# Patient Record
Sex: Female | Born: 1955 | Hispanic: No | Smoking: Smoker, current status unknown
Health system: Southern US, Community
[De-identification: ages and names within clinical notes are randomized; demographics above are authoritative.]

## PROBLEM LIST (undated history)

## (undated) DIAGNOSIS — A0472 Enterocolitis due to Clostridium difficile, not specified as recurrent: Secondary | ICD-10-CM

## (undated) DIAGNOSIS — I255 Ischemic cardiomyopathy: Secondary | ICD-10-CM

## (undated) DIAGNOSIS — I482 Chronic atrial fibrillation, unspecified: Secondary | ICD-10-CM

## (undated) DIAGNOSIS — Z85819 Personal history of malignant neoplasm of unspecified site of lip, oral cavity, and pharynx: Secondary | ICD-10-CM

## (undated) DIAGNOSIS — F329 Major depressive disorder, single episode, unspecified: Secondary | ICD-10-CM

## (undated) DIAGNOSIS — F32A Depression, unspecified: Secondary | ICD-10-CM

## (undated) DIAGNOSIS — J9621 Acute and chronic respiratory failure with hypoxia: Secondary | ICD-10-CM

## (undated) DIAGNOSIS — I5021 Acute systolic (congestive) heart failure: Secondary | ICD-10-CM

## (undated) DIAGNOSIS — J189 Pneumonia, unspecified organism: Secondary | ICD-10-CM

## (undated) DIAGNOSIS — Z430 Encounter for attention to tracheostomy: Secondary | ICD-10-CM

## (undated) DIAGNOSIS — F419 Anxiety disorder, unspecified: Secondary | ICD-10-CM

## (undated) DIAGNOSIS — D7582 Heparin induced thrombocytopenia (HIT): Secondary | ICD-10-CM

## (undated) HISTORY — PX: TRACHEOSTOMY: SUR1362

## (undated) HISTORY — PX: CORONARY ARTERY BYPASS GRAFT: SHX141

## (undated) HISTORY — PX: ABOVE KNEE LEG AMPUTATION: SUR20

## (undated) HISTORY — PX: MANDIBULECTOMY: SHX1999

## (undated) HISTORY — PX: PEG PLACEMENT: SHX5437

---

## 2018-06-09 DIAGNOSIS — C4492 Squamous cell carcinoma of skin, unspecified: Secondary | ICD-10-CM

## 2018-06-09 DIAGNOSIS — A419 Sepsis, unspecified organism: Secondary | ICD-10-CM

## 2018-06-09 DIAGNOSIS — I5022 Chronic systolic (congestive) heart failure: Secondary | ICD-10-CM

## 2018-06-09 DIAGNOSIS — I482 Chronic atrial fibrillation: Secondary | ICD-10-CM | POA: Diagnosis not present

## 2018-06-09 DIAGNOSIS — J9621 Acute and chronic respiratory failure with hypoxia: Secondary | ICD-10-CM

## 2018-06-10 DIAGNOSIS — C4492 Squamous cell carcinoma of skin, unspecified: Secondary | ICD-10-CM

## 2018-06-10 DIAGNOSIS — I5022 Chronic systolic (congestive) heart failure: Secondary | ICD-10-CM | POA: Diagnosis not present

## 2018-06-10 DIAGNOSIS — A419 Sepsis, unspecified organism: Secondary | ICD-10-CM | POA: Diagnosis not present

## 2018-06-10 DIAGNOSIS — J9621 Acute and chronic respiratory failure with hypoxia: Secondary | ICD-10-CM | POA: Diagnosis not present

## 2018-06-10 DIAGNOSIS — I482 Chronic atrial fibrillation: Secondary | ICD-10-CM

## 2018-07-20 ENCOUNTER — Inpatient Hospital Stay
Admission: EM | Admit: 2018-07-20 | Discharge: 2018-08-16 | Disposition: A | Discharge status: Rehabilitation Facility | Payer: Medicare Other | Source: Other Acute Inpatient Hospital | Attending: Internal Medicine | Admitting: Internal Medicine

## 2018-07-20 ENCOUNTER — Other Ambulatory Visit (HOSPITAL_COMMUNITY): Payer: Self-pay

## 2018-07-20 DIAGNOSIS — R58 Hemorrhage, not elsewhere classified: Secondary | ICD-10-CM

## 2018-07-20 DIAGNOSIS — Z431 Encounter for attention to gastrostomy: Secondary | ICD-10-CM

## 2018-07-20 DIAGNOSIS — Z931 Gastrostomy status: Secondary | ICD-10-CM

## 2018-07-20 DIAGNOSIS — F330 Major depressive disorder, recurrent, mild: Secondary | ICD-10-CM

## 2018-07-20 DIAGNOSIS — J189 Pneumonia, unspecified organism: Secondary | ICD-10-CM

## 2018-07-20 DIAGNOSIS — J9690 Respiratory failure, unspecified, unspecified whether with hypoxia or hypercapnia: Secondary | ICD-10-CM

## 2018-07-20 DIAGNOSIS — J90 Pleural effusion, not elsewhere classified: Secondary | ICD-10-CM

## 2018-07-20 DIAGNOSIS — Z430 Encounter for attention to tracheostomy: Secondary | ICD-10-CM

## 2018-07-20 DIAGNOSIS — D75829 Heparin-induced thrombocytopenia, unspecified: Secondary | ICD-10-CM

## 2018-07-20 DIAGNOSIS — D7582 Heparin induced thrombocytopenia (HIT): Secondary | ICD-10-CM

## 2018-07-20 DIAGNOSIS — J9621 Acute and chronic respiratory failure with hypoxia: Secondary | ICD-10-CM | POA: Diagnosis present

## 2018-07-20 DIAGNOSIS — I5021 Acute systolic (congestive) heart failure: Secondary | ICD-10-CM | POA: Diagnosis present

## 2018-07-20 DIAGNOSIS — F411 Generalized anxiety disorder: Secondary | ICD-10-CM

## 2018-07-20 DIAGNOSIS — I482 Chronic atrial fibrillation, unspecified: Secondary | ICD-10-CM | POA: Diagnosis present

## 2018-07-20 HISTORY — DX: Acute systolic (congestive) heart failure: I50.21

## 2018-07-20 HISTORY — DX: Pneumonia, unspecified organism: J18.9

## 2018-07-20 HISTORY — DX: Personal history of malignant neoplasm of unspecified site of lip, oral cavity, and pharynx: Z85.819

## 2018-07-20 HISTORY — DX: Acute and chronic respiratory failure with hypoxia: J96.21

## 2018-07-20 HISTORY — DX: Chronic atrial fibrillation, unspecified: I48.20

## 2018-07-20 HISTORY — DX: Ischemic cardiomyopathy: I25.5

## 2018-07-20 HISTORY — DX: Anxiety disorder, unspecified: F41.9

## 2018-07-20 HISTORY — DX: Major depressive disorder, single episode, unspecified: F32.9

## 2018-07-20 HISTORY — DX: Enterocolitis due to Clostridium difficile, not specified as recurrent: A04.72

## 2018-07-20 HISTORY — DX: Encounter for attention to tracheostomy: Z43.0

## 2018-07-20 HISTORY — DX: Depression, unspecified: F32.A

## 2018-07-20 HISTORY — DX: Heparin induced thrombocytopenia (HIT): D75.82

## 2018-07-20 LAB — BLOOD GAS, ARTERIAL
Acid-Base Excess: 2.8 mmol/L — ABNORMAL HIGH (ref 0.0–2.0)
Bicarbonate: 27.3 mmol/L (ref 20.0–28.0)
FIO2: 40
MECHVT: 400 mL
O2 Saturation: 95.6 %
PEEP: 5 cmH2O
PO2 ART: 82.8 mmHg — AB (ref 83.0–108.0)
Patient temperature: 98.6
RATE: 16 resp/min
pCO2 arterial: 45.9 mmHg (ref 32.0–48.0)
pH, Arterial: 7.392 (ref 7.350–7.450)

## 2018-07-20 MED ORDER — IOPAMIDOL (ISOVUE-300) INJECTION 61%
50.0000 mL | Freq: Once | INTRAVENOUS | Status: AC | PRN
  Administered 2018-07-20: 50 mL

## 2018-07-21 ENCOUNTER — Other Ambulatory Visit (HOSPITAL_COMMUNITY): Payer: Self-pay

## 2018-07-21 LAB — BASIC METABOLIC PANEL
Anion gap: 9 (ref 5–15)
BUN: 19 mg/dL (ref 6–20)
CHLORIDE: 101 mmol/L (ref 98–111)
CO2: 27 mmol/L (ref 22–32)
Calcium: 8.3 mg/dL — ABNORMAL LOW (ref 8.9–10.3)
Creatinine, Ser: 0.68 mg/dL (ref 0.44–1.00)
GFR calc Af Amer: >60 mL/min (ref >60)
GFR calc non Af Amer: >60 mL/min (ref >60)
Glucose, Bld: 107 mg/dL — ABNORMAL HIGH (ref 70–99)
POTASSIUM: 3.5 mmol/L (ref 3.5–5.1)
Sodium: 137 mmol/L (ref 135–145)

## 2018-07-21 LAB — CBC WITH DIFFERENTIAL/PLATELET
ABS IMMATURE GRANULOCYTES: 0.1 10*3/uL (ref 0.0–0.1)
BASOS ABS: 0.2 10*3/uL — AB (ref 0.0–0.1)
Basophils Relative: 1 %
Eosinophils Absolute: 0.7 10*3/uL (ref 0.0–0.7)
Eosinophils Relative: 4 %
HCT: 28.4 % — ABNORMAL LOW (ref 36.0–46.0)
HEMOGLOBIN: 8.7 g/dL — AB (ref 12.0–15.0)
Immature Granulocytes: 1 %
LYMPHS PCT: 9 %
Lymphs Abs: 1.5 10*3/uL (ref 0.7–4.0)
MCH: 27.2 pg (ref 26.0–34.0)
MCHC: 30.6 g/dL (ref 30.0–36.0)
MCV: 88.8 fL (ref 78.0–100.0)
MONO ABS: 1.1 10*3/uL — AB (ref 0.1–1.0)
MONOS PCT: 7 %
NEUTROS ABS: 13.4 10*3/uL — AB (ref 1.7–7.7)
Neutrophils Relative %: 78 %
Platelets: 843 10*3/uL — ABNORMAL HIGH (ref 150–400)
RBC: 3.2 MIL/uL — ABNORMAL LOW (ref 3.87–5.11)
RDW: 17.2 % — ABNORMAL HIGH (ref 11.5–15.5)
WBC: 17 10*3/uL — ABNORMAL HIGH (ref 4.0–10.5)

## 2018-07-22 LAB — URINALYSIS, ROUTINE W REFLEX MICROSCOPIC
Bacteria, UA: NONE SEEN
Bilirubin Urine: NEGATIVE
GLUCOSE, UA: NEGATIVE mg/dL
HGB URINE DIPSTICK: NEGATIVE
Ketones, ur: NEGATIVE mg/dL
Nitrite: NEGATIVE
PH: 6 (ref 5.0–8.0)
Protein, ur: 30 mg/dL — AB
SPECIFIC GRAVITY, URINE: 1.02 (ref 1.005–1.030)

## 2018-07-22 LAB — CBC WITH DIFFERENTIAL/PLATELET
ABS IMMATURE GRANULOCYTES: 0.1 10*3/uL (ref 0.0–0.1)
Basophils Absolute: 0.2 10*3/uL — ABNORMAL HIGH (ref 0.0–0.1)
Basophils Relative: 1 %
EOS PCT: 4 %
Eosinophils Absolute: 0.6 10*3/uL (ref 0.0–0.7)
HCT: 26.8 % — ABNORMAL LOW (ref 36.0–46.0)
HEMOGLOBIN: 8.1 g/dL — AB (ref 12.0–15.0)
Immature Granulocytes: 1 %
LYMPHS ABS: 1.2 10*3/uL (ref 0.7–4.0)
LYMPHS PCT: 7 %
MCH: 26.9 pg (ref 26.0–34.0)
MCHC: 30.2 g/dL (ref 30.0–36.0)
MCV: 89 fL (ref 78.0–100.0)
MONO ABS: 1.1 10*3/uL — AB (ref 0.1–1.0)
Monocytes Relative: 7 %
Neutro Abs: 13.7 10*3/uL — ABNORMAL HIGH (ref 1.7–7.7)
Neutrophils Relative %: 80 %
Platelets: 783 10*3/uL — ABNORMAL HIGH (ref 150–400)
RBC: 3.01 MIL/uL — ABNORMAL LOW (ref 3.87–5.11)
RDW: 17.2 % — ABNORMAL HIGH (ref 11.5–15.5)
WBC: 17 10*3/uL — ABNORMAL HIGH (ref 4.0–10.5)

## 2018-07-22 LAB — C DIFFICILE QUICK SCREEN W PCR REFLEX
C DIFFICILE (CDIFF) INTERP: NOT DETECTED
C DIFFICILE (CDIFF) TOXIN: NEGATIVE
C DIFFICLE (CDIFF) ANTIGEN: NEGATIVE

## 2018-07-22 LAB — PHOSPHORUS: PHOSPHORUS: 4.8 mg/dL — AB (ref 2.5–4.6)

## 2018-07-22 LAB — BASIC METABOLIC PANEL
Anion gap: 9 (ref 5–15)
BUN: 17 mg/dL (ref 6–20)
CHLORIDE: 99 mmol/L (ref 98–111)
CO2: 27 mmol/L (ref 22–32)
Calcium: 8.2 mg/dL — ABNORMAL LOW (ref 8.9–10.3)
Creatinine, Ser: 0.66 mg/dL (ref 0.44–1.00)
GFR calc non Af Amer: >60 mL/min (ref >60)
Glucose, Bld: 122 mg/dL — ABNORMAL HIGH (ref 70–99)
POTASSIUM: 4.4 mmol/L (ref 3.5–5.1)
SODIUM: 135 mmol/L (ref 135–145)

## 2018-07-22 LAB — HEMOGLOBIN A1C
HEMOGLOBIN A1C: 5.3 % (ref 4.8–5.6)
MEAN PLASMA GLUCOSE: 105.41 mg/dL

## 2018-07-22 LAB — TSH: TSH: 3.777 u[IU]/mL (ref 0.350–4.500)

## 2018-07-22 LAB — PROTIME-INR
INR: 1.47
Prothrombin Time: 17.7 seconds — ABNORMAL HIGH (ref 11.4–15.2)

## 2018-07-22 LAB — MAGNESIUM: MAGNESIUM: 1.7 mg/dL (ref 1.7–2.4)

## 2018-07-23 DIAGNOSIS — I482 Chronic atrial fibrillation: Secondary | ICD-10-CM

## 2018-07-23 DIAGNOSIS — I5021 Acute systolic (congestive) heart failure: Secondary | ICD-10-CM

## 2018-07-23 DIAGNOSIS — I96 Gangrene, not elsewhere classified: Secondary | ICD-10-CM

## 2018-07-23 DIAGNOSIS — D7582 Heparin induced thrombocytopenia (HIT): Secondary | ICD-10-CM

## 2018-07-23 DIAGNOSIS — J9621 Acute and chronic respiratory failure with hypoxia: Secondary | ICD-10-CM

## 2018-07-23 DIAGNOSIS — Z430 Encounter for attention to tracheostomy: Secondary | ICD-10-CM

## 2018-07-23 DIAGNOSIS — J189 Pneumonia, unspecified organism: Secondary | ICD-10-CM

## 2018-07-23 MED ORDER — CHLORHEXIDINE GLUCONATE 0.12 % MT SOLN
15.00 | OROMUCOSAL | Status: DC
Start: ? — End: 2018-07-23

## 2018-07-23 MED ORDER — FERROUS SULFATE 300 (60 FE) MG/5ML PO SYRP
300.00 | ORAL_SOLUTION | ORAL | Status: DC
Start: 2018-07-21 — End: 2018-07-23

## 2018-07-23 MED ORDER — GENERIC EXTERNAL MEDICATION
1.00 | Status: DC
Start: 2018-07-21 — End: 2018-07-23

## 2018-07-23 MED ORDER — FUROSEMIDE 40 MG PO TABS
40.00 | ORAL_TABLET | ORAL | Status: DC
Start: 2018-07-21 — End: 2018-07-23

## 2018-07-23 MED ORDER — FOLIC ACID 1 MG PO TABS
1.00 | ORAL_TABLET | ORAL | Status: DC
Start: 2018-07-21 — End: 2018-07-23

## 2018-07-23 MED ORDER — ALBUTEROL SULFATE (2.5 MG/3ML) 0.083% IN NEBU
2.50 | INHALATION_SOLUTION | RESPIRATORY_TRACT | Status: DC
Start: ? — End: 2018-07-23

## 2018-07-23 MED ORDER — LORAZEPAM 2 MG/ML IJ SOLN
1.00 | INTRAMUSCULAR | Status: DC
Start: ? — End: 2018-07-23

## 2018-07-23 MED ORDER — IPRATROPIUM-ALBUTEROL 0.5-2.5 (3) MG/3ML IN SOLN
3.00 | RESPIRATORY_TRACT | Status: DC
Start: 2018-07-20 — End: 2018-07-23

## 2018-07-23 MED ORDER — ATORVASTATIN CALCIUM 10 MG PO TABS
20.00 | ORAL_TABLET | ORAL | Status: DC
Start: 2018-07-21 — End: 2018-07-23

## 2018-07-23 MED ORDER — ESCITALOPRAM OXALATE 5 MG PO TABS
5.00 | ORAL_TABLET | ORAL | Status: DC
Start: 2018-07-21 — End: 2018-07-23

## 2018-07-23 MED ORDER — DEXTROSE 10 % IV SOLN
125.00 | INTRAVENOUS | Status: DC
Start: ? — End: 2018-07-23

## 2018-07-23 MED ORDER — SODIUM HYPOCHLORITE EX
1.00 | CUTANEOUS | Status: DC
Start: 2018-07-20 — End: 2018-07-23

## 2018-07-23 MED ORDER — GENERIC EXTERNAL MEDICATION
50.00 | Status: DC
Start: ? — End: 2018-07-23

## 2018-07-23 MED ORDER — BUSPIRONE HCL 5 MG PO TABS
15.00 | ORAL_TABLET | ORAL | Status: DC
Start: 2018-07-20 — End: 2018-07-23

## 2018-07-23 MED ORDER — VANCOMYCIN HCL 50 MG/ML PO SOLR
125.00 | ORAL | Status: DC
Start: 2018-07-20 — End: 2018-07-23

## 2018-07-23 MED ORDER — METOPROLOL TARTRATE 25 MG PO TABS
12.50 | ORAL_TABLET | ORAL | Status: DC
Start: 2018-07-20 — End: 2018-07-23

## 2018-07-23 MED ORDER — CALCIUM-VITAMIN D3 250-125 MG-UNIT PO TABS
1.00 | ORAL_TABLET | ORAL | Status: DC
Start: 2018-07-21 — End: 2018-07-23

## 2018-07-23 MED ORDER — MEXILETINE HCL 150 MG PO CAPS
150.00 | ORAL_CAPSULE | ORAL | Status: DC
Start: 2018-07-20 — End: 2018-07-23

## 2018-07-23 MED ORDER — RANITIDINE HCL 150 MG PO TABS
150.00 | ORAL_TABLET | ORAL | Status: DC
Start: 2018-07-20 — End: 2018-07-23

## 2018-07-23 MED ORDER — OXYCODONE HCL 5 MG PO TABS
5.00 | ORAL_TABLET | ORAL | Status: DC
Start: ? — End: 2018-07-23

## 2018-07-23 MED ORDER — CLONAZEPAM 1 MG PO TABS
1.00 | ORAL_TABLET | ORAL | Status: DC
Start: 2018-07-20 — End: 2018-07-23

## 2018-07-23 MED ORDER — AMIODARONE HCL 200 MG PO TABS
200.00 | ORAL_TABLET | ORAL | Status: DC
Start: 2018-07-20 — End: 2018-07-23

## 2018-07-23 MED ORDER — GABAPENTIN 300 MG PO CAPS
300.00 | ORAL_CAPSULE | ORAL | Status: DC
Start: 2018-07-20 — End: 2018-07-23

## 2018-07-23 MED ORDER — GENERIC EXTERNAL MEDICATION
Status: DC
Start: ? — End: 2018-07-23

## 2018-07-23 MED ORDER — TICAGRELOR 90 MG PO TABS
90.00 | ORAL_TABLET | ORAL | Status: DC
Start: 2018-07-20 — End: 2018-07-23

## 2018-07-23 MED ORDER — CHLORHEXIDINE GLUCONATE 0.12 % MT SOLN
15.00 | OROMUCOSAL | Status: DC
Start: 2018-07-20 — End: 2018-07-23

## 2018-07-23 MED ORDER — GENERIC EXTERNAL MEDICATION
2.00 | Status: DC
Start: ? — End: 2018-07-23

## 2018-07-23 MED ORDER — APIXABAN 5 MG PO TABS
5.00 | ORAL_TABLET | ORAL | Status: DC
Start: 2018-07-20 — End: 2018-07-23

## 2018-07-23 MED ORDER — GENERIC EXTERNAL MEDICATION
1.00 g | Status: DC
Start: 2018-07-21 — End: 2018-07-23

## 2018-07-23 MED ORDER — THIAMINE HCL 100 MG PO TABS
100.00 | ORAL_TABLET | ORAL | Status: DC
Start: 2018-07-21 — End: 2018-07-23

## 2018-07-23 NOTE — Consult Note (Addendum)
[facility] [place] NOTE   MRN : [id number]  Reason for Consult: Left pointer and 5th digit skin changes with dry gangrene.  Referring Physician: [facility] Claudine Mouton  History of Present Illness: 62 y/o female Who was recently discharged from [facility].  She presented with ischemic right foot 7/16.  S/p Right AKA.  On 7/19 the patient developed left hand dusky finger tips.  Vascular at [facility] was notified.  Warm blankets help no further work up was done.  We have been consult to evaluate her left hand for ischemic changes to the pointer and 5th digits with dry gangrene.    Medical history includes: Active problems, CAD s/p CABG, paroxysmal A fib, right LE ischemia s/p AKA, Respiratory failure s/p Trach and PEG, HIT, HAP, C diff,  pleural effusion, cellulitis G tube insertion site.  Tobacco abuse, chronic ETOH, metastatic SCC       No current facility-administered medications for this encounter.     Pt meds include: Statin :Yes Betablocker: Yes ASA: No Other anticoagulants/antiplatelets: Brilliant and Eliquis  Past Medical History:  Diagnosis Date  . Acute on chronic respiratory failure with hypoxia ([place])   . Acute systolic heart failure ([place])   . Anxiety   . C. difficile colitis   . Chronic atrial fibrillation ([place])   . Depression   . H/O oral cancer   . Healthcare-associated pneumonia 07/24/2018  . HIT (heparin-induced thrombocytopenia) ([place]) 07/24/2018  . Ischemic cardiomyopathy   . Tracheostomy care [facility])     On paper chart   Social History Social History   Tobacco Use  . Smoking status: Smoker, Current Status Unknown  . Smokeless tobacco: Never Used  Substance Use Topics  . Alcohol use: Not Currently  . Drug use: Not Currently    Family History Family History  Family history unknown: Yes  on paper chart  Allergies not on file   REVIEW OF SYSTEMS  General: [ ]  Weight loss, [ ]  Fever, [ ]   chills Neurologic: [ ]  Dizziness, [ ]  Blackouts, [ ]  Seizure [ ]  Stroke, [ ]  "Mini stroke", [ ]  Slurred speech, [ ]  Temporary blindness; [ ]  weakness in arms or legs, [ ]  Hoarseness [ ]  Dysphagia Cardiac: [ ]  Chest pain/pressure, [x ] Shortness of breath at rest [ ]  Shortness of breath with exertion, [x ] Atrial fibrillation or irregular heartbeat  Vascular: [ ]  Pain in legs with walking, [ ]  Pain in legs at rest, [ ]  Pain in legs at night,  [ ]  Non-healing ulcer, [ ]  Blood clot in vein/DVT,  Left hand fingers dry gangrene [x]  Pulmonary: [ ]  Home oxygen, [ ]  Productive cough, [ ]  Coughing up blood, [ ]  Asthma,  [ ]  Wheezing [x ] COPD trach [x]  Musculoskeletal:  [ ]  Arthritis, [ ]  Low back pain, [ ]  Joint pain Hematologic: [x ] Easy Bruising, [x ] Anemia; [ ]  Hepatitis Gastrointestinal: [ ]  Blood in stool, [ ]  Gastroesophageal Reflux/heartburn, Urinary: [ ]  chronic Kidney disease, [ ]  on HD - [ ]  MWF or [ ]  TTHS, [ ]  Burning with urination, [ ]  Difficulty urinating Skin: [ ]  Rashes, [x ] Wounds Psychological: [x ] Anxiety, [x ] Depression  Physical Examination There were no vitals filed for this visit. There is no height or weight on file to calculate BMI.  General:  WDWN in NAD  HENT: WNL, normocephalic Eyes: Pupils equal Pulmonary: normal non-labored breathing , without Rales, rhonchi,  wheezing Cardiac:irregularly irregular Abdomen: soft, NT, + BS, Peg tube  Skin: no rashes, ulcers noted;  Positive left 2nd tip of finger, dry skin 5th finger tip Gangrene , no cellulitis; no open wounds;   Vascular Exam/Pulses:Palpable radial pulse B UE, with doppler signal of the palmer arch, palpable Femoral pulses B, Doppler left DP/PT, right AKA stump well healed, warm to touch   Musculoskeletal: positive muscle wasting or atrophy in all 4 limbs; no edema    SENSATION: normal; MOTOR FUNCTION: 5/5 Symmetric, moves all ext. Speech is fluent/normal   Significant Diagnostic Studies: CBC Lab  Results  Component Value Date   WBC 12.8 (H) 08/07/2018   HGB 7.9 (L) 08/07/2018   HCT 25.5 (L) 08/07/2018   MCV 92.1 08/07/2018   PLT 411 (H) 08/07/2018    BMET    Component Value Date/Time   NA 142 08/07/2018 1148   K 4.7 08/07/2018 1148   CL 104 08/07/2018 1148   CO2 27 08/07/2018 1148   GLUCOSE 103 (H) 08/07/2018 1148   BUN 71 (H) 08/07/2018 1148   CREATININE 1.14 (H) 08/07/2018 1148   CALCIUM 9.1 08/07/2018 1148   GFRNONAA 51 (L) 08/07/2018 1148   GFRAA 59 (L) 08/07/2018 1148   CrCl cannot be calculated (Unknown ideal weight.).  COAG Lab Results  Component Value Date   INR 1.18 08/02/2018   INR 1.47 07/22/2018     Non-Invasive Vascular Imaging: pending left UE arterial duplex Left: >50% stenosis visualized in the left upper extremity.   Obstruction noted in the axillary artery.  ASSESSMENT/PLAN:  PAD s/p ischemia right LE ending in AKA well healed Axillary artery stenosis left UE Dry gangrene 2nd finger tip left hand, no drainage, no erythema.  No sign of infection or opening in the skin.     She has a history of PAD and A fib.  She likely embolized to her left finger tips or related to her HIT.  I have read the discharge summery from [facility] and I have not found any documentation on the use of pressures.    We will observe the finger tips for signs or symptoms infection, otherwise we will let the digits demarcate.    Roxy Horseman 08/07/2018 3:24 PM   Agree with above.  Will get left upper extremity arterial duplex to rule out large vessel disease but with palpable left radial pulse in this vent dependent pt likely no intervention except amputation of tip of second finger left hand after fully demarcated.  Would only consider this for severe pain control or infection at this point.  Will follow up on duplex result.  If large vessels are all patent would have follow up with hand surgery after she is discharged or call them if infected or  progression of demarcation.  Ruta Hinds, MD [facility] [place] Office: [phone] Pager: [phone]

## 2018-07-23 NOTE — Consult Note (Signed)
Pulmonary [place]  Date of Service: 07/23/2018  PULMONARY CRITICAL CARE CONSULT   Shannon Roman  WER:[id number]  DOB: 1956-08-21   DOA: 07/20/2018  Referring Physician: Merton Border, MD  HPI: Shannon Roman is a 62 y.o. female seen for follow up of Acute on Chronic Respiratory Failure.  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 difficile colitis malnutrition who originally presented to the hospital with metastatic squamous cell carcinoma underwent a radical neck dissection in June.  Patient also had a mandibulectomy done at that time on the June 25 by ENT postoperative course was very complicated developed atrial fibrillation ventricular tachycardia cardiac arrest with torsades.  Patient also had developed a healthcare associated pneumonia involving Serratia with wound infections.  Apparently was discharged to long-term acute care facility and then came back to the acute side complaining about a cold foot.  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  This did resolve.  When she came into the acute care facility she was having mottling and they were unable to obtain pulses.  The ischemic limb was initially medically managed however then when she underwent a right-sided above-the-knee amputation.  Patient afterwards also developed mottling of the upper extremities however this resolved after warming.  Review of Systems:  ROS performed and is unremarkable other than noted above.  Past Medical History:  Diagnosis Date  . Acute on chronic respiratory failure with hypoxia ([place])   . Acute systolic heart failure  ([place])   . Anxiety   . C. difficile colitis   . Chronic atrial fibrillation ([place])   . Depression   . H/O oral cancer   . Ischemic cardiomyopathy   . Tracheostomy care [facility])     Past Surgical History:  Procedure Laterality Date  . ABOVE KNEE LEG AMPUTATION    . CORONARY ARTERY BYPASS GRAFT    . MANDIBULECTOMY    . PEG PLACEMENT    . TRACHEOSTOMY      Social History:    reports that she has been smoking. She has never used smokeless tobacco. She reports that she drank alcohol. She reports that she has current or past drug history.  Family History: Non-Contributory to the present illness  Allergies reviewed  Medications: Reviewed on Rounds  Physical Exam:  Vitals: Temperature 97.5 pulse 74 respiratory rate 33 blood pressure 126/66 saturations 96%  Ventilator Settings mode of ventilation assist control FiO2 45% tidal volume 446 PEEP 5  . General: Comfortable at this time . Eyes: Grossly normal lids, irises & conjunctiva . ENT: grossly tongue is normal . Neck: no obvious mass . Cardiovascular: S1-S2 normal no gallop or rub . Respiratory: No rhonchi no rales . Abdomen: Soft nontender . Skin: no rash seen on limited exam . Musculoskeletal: not rigid . Psychiatric:unable to assess . Neurologic: no seizure no involuntary movements         Labs on Admission:  Basic Metabolic Panel: Recent Labs  Lab 07/21/18 0502 07/22/18 0731  NA 137 135  K 3.5 4.4  CL 101 99  CO2 27 27  GLUCOSE 107* 122*  BUN 19 17  CREATININE 0.68 0.66  CALCIUM 8.3* 8.2*  MG  --  1.7  PHOS  --  4.8*    Liver Function Tests: No results for input(s): AST, ALT, ALKPHOS, BILITOT, PROT, ALBUMIN in the last 168 hours. No results for input(s): LIPASE, AMYLASE in the last 168 hours. No results for input(s): AMMONIA in the last 168 hours.  CBC: Recent Labs  Lab 07/21/18 0502 07/22/18 0731  WBC 17.0* 17.0*  NEUTROABS 13.4* 13.7*  HGB 8.7* 8.1*  HCT 28.4* 26.8*  MCV 88.8 89.0  PLT 843*  783*    Cardiac Enzymes: No results for input(s): CKTOTAL, CKMB, CKMBINDEX, TROPONINI in the last 168 hours.  BNP (last 3 results) No results for input(s): BNP in the last 8760 hours.  ProBNP (last 3 results) No results for input(s): PROBNP in the last 8760 hours.   Radiological Exams on Admission: Dg Abdomen Peg Tube Location  Result Date: 07/20/2018 CLINICAL DATA:  Initial evaluation for gastrostomy tube placement. EXAM: ABDOMEN - 1 VIEW COMPARISON:  None. FINDINGS: Contrast material is been instilled via a percutaneous gastrostomy tube. Contrast seen filling the gastric fundus and body. No contrast extravasation to suggest leak or malpositioning. Bowel gas pattern within normal limits. Prominent aorto bi-iliac atherosclerotic calcifications. Degenerative changes noted within the visualized spine and about the hips bilaterally. IMPRESSION: Percutaneous G-tube within the stomach. No extravasation of contrast to suggest leak or malpositioning. Electronically Signed   By: Jeannine Boga M.D.   On: 07/20/2018 23:22   Dg Chest Port 1 View  Result Date: 07/21/2018 CLINICAL DATA:  Respiratory failure. EXAM: PORTABLE CHEST 1 VIEW COMPARISON:  Radiograph of October 26, 2015. FINDINGS: Stable cardiomediastinal silhouette. Tracheostomy tube is noted in grossly good position. Status post coronary artery bypass graft. No pneumothorax is noted. Bilateral reticular densities are noted throughout both lungs concerning for edema or possibly pneumonia. Minimal pleural effusions may be present. Bony thorax is unremarkable. IMPRESSION: Tracheostomy tube in grossly good position. Bilateral diffuse reticular densities are noted concerning for edema or possibly pneumonia. Electronically Signed   By: Marijo Conception, M.D.   On: 07/21/2018 13:27    Assessment/Plan Active Problems:   Acute on chronic respiratory failure with hypoxia (HCC)   Chronic atrial fibrillation (HCC)   Acute systolic heart failure  (HCC)   Tracheostomy care ([place])   Healthcare-associated pneumonia   HIT (heparin-induced thrombocytopenia) ([place])   1. Acute on chronic respiratory failure with hypoxia at this time patient is on full vent support.  She has a poor prognosis because of her history of congestive heart failure.  Last chest x-ray showed some fluid overload present.  Prior to being able to wean think she will need to have her cardiac status optimized. 2. Acute on chronic chronic systolic heart failure as above poor prognosis for weaning because of low ejection fraction.  She needs to have her cardiac status optimized I will diurese aggressively as tolerated. 3. Chronic atrial fibrillation currently rate is controlled we will continue with supportive care and current management. 4. Healthcare associated pneumonia treated with antibiotics we will continue supportive care. 5. Tracheostomy status to think the tracheostomy will need to remain in place secondary to her underlying diagnosis of oral cancer. 6. HIT syndrome avoid heparin products  I have personally seen and evaluated the patient, evaluated laboratory and imaging results, formulated the assessment and plan and placed orders. The Patient requires high complexity decision making for assessment and support.  Case was discussed on Rounds with the Respiratory Therapy Staff Time Spent 24minutes  Allyne Gee, MD [facility] Pulmonary Critical Care Medicine  Sleep Medicine

## 2018-07-24 ENCOUNTER — Encounter: Payer: Self-pay | Admitting: Internal Medicine

## 2018-07-24 ENCOUNTER — Ambulatory Visit (HOSPITAL_COMMUNITY): Payer: No Typology Code available for payment source | Attending: Physician Assistant

## 2018-07-24 DIAGNOSIS — I5021 Acute systolic (congestive) heart failure: Secondary | ICD-10-CM | POA: Diagnosis present

## 2018-07-24 DIAGNOSIS — J9621 Acute and chronic respiratory failure with hypoxia: Secondary | ICD-10-CM | POA: Diagnosis not present

## 2018-07-24 DIAGNOSIS — J189 Pneumonia, unspecified organism: Secondary | ICD-10-CM | POA: Diagnosis not present

## 2018-07-24 DIAGNOSIS — I482 Chronic atrial fibrillation, unspecified: Secondary | ICD-10-CM | POA: Diagnosis present

## 2018-07-24 DIAGNOSIS — D7582 Heparin induced thrombocytopenia (HIT): Secondary | ICD-10-CM

## 2018-07-24 DIAGNOSIS — I70208 Unspecified atherosclerosis of native arteries of extremities, other extremity: Secondary | ICD-10-CM | POA: Diagnosis present

## 2018-07-24 DIAGNOSIS — Z430 Encounter for attention to tracheostomy: Secondary | ICD-10-CM

## 2018-07-24 DIAGNOSIS — D75829 Heparin-induced thrombocytopenia, unspecified: Secondary | ICD-10-CM

## 2018-07-24 DIAGNOSIS — I739 Peripheral vascular disease, unspecified: Secondary | ICD-10-CM

## 2018-07-24 DIAGNOSIS — I70209 Unspecified atherosclerosis of native arteries of extremities, unspecified extremity: Secondary | ICD-10-CM | POA: Diagnosis not present

## 2018-07-24 HISTORY — DX: Heparin induced thrombocytopenia (HIT): D75.82

## 2018-07-24 HISTORY — DX: Pneumonia, unspecified organism: J18.9

## 2018-07-24 HISTORY — DX: Heparin-induced thrombocytopenia, unspecified: D75.829

## 2018-07-24 LAB — URINE CULTURE: Culture: >=100000 — AB

## 2018-07-24 LAB — CULTURE, RESPIRATORY W GRAM STAIN

## 2018-07-24 LAB — CULTURE, RESPIRATORY: GRAM STAIN: NONE SEEN

## 2018-07-24 NOTE — Consult Note (Signed)
Duplex reviewed.  Possible axillary artery stenosis.  Pt is currently in no shape for an intervention.  Would treat conservatively for now.  Please reconsult when pt is off vent and overall more stable.  If wounds progress would suggest hand surgery consult  Ruta Hinds, MD [facility] of [place] Office: [phone] Pager: [phone]

## 2018-07-24 NOTE — Progress Notes (Signed)
LUE arterial duplex prelim: >50% axillary artery stenosis. Landry Mellow, RDMS, RVT

## 2018-07-24 NOTE — Progress Notes (Signed)
Pulmonary Critical Care Medicine [place]   PULMONARY CRITICAL CARE SERVICE  PROGRESS NOTE  Date of Service: 07/24/2018  Shannon Roman  UEA:[id number]  DOB: [birth year]   DOA: 07/20/2018  Referring Physician: Merton Border, MD  HPI: Shannon Roman is a 62 y.o. female seen for follow up of Acute on Chronic Respiratory Failure.  Patient is on full vent support was attempted on pressure support weaning did not tolerate it today.  Patient has been having increased work of breathing well attempting to wean.  Medications: Reviewed on Rounds  Physical Exam:  Vitals: Temperature 97.6 pulse 75 respiratory rate 34 blood pressure 117/68 saturations 95%  Ventilator Settings mode of ventilation assist control FiO2 45% tidal volume 424 PEEP 5  . General: Comfortable at this time . Eyes: Grossly normal lids, irises & conjunctiva . ENT: grossly tongue is normal . Neck: no obvious mass . Cardiovascular: S1 S2 normal no gallop . Respiratory: Coarse rhonchi are noted bilaterally. . Abdomen: soft . Skin: no rash seen on limited exam . Musculoskeletal: not rigid . Psychiatric:unable to assess . Neurologic: no seizure no involuntary movements         Lab Data:   Basic Metabolic Panel: Recent Labs  Lab 07/21/18 0502 07/22/18 0731  NA 137 135  K 3.5 4.4  CL 101 99  CO2 27 27  GLUCOSE 107* 122*  BUN 19 17  CREATININE 0.68 0.66  CALCIUM 8.3* 8.2*  MG  --  1.7  PHOS  --  4.8*    Liver Function Tests: No results for input(s): AST, ALT, ALKPHOS, BILITOT, PROT, ALBUMIN in the last 168 hours. No results for input(s): LIPASE, AMYLASE in the last 168 hours. No results for input(s): AMMONIA in the last 168 hours.  CBC: Recent Labs  Lab 07/21/18 0502 07/22/18 0731  WBC 17.0* 17.0*  NEUTROABS 13.4* 13.7*  HGB 8.7* 8.1*  HCT 28.4* 26.8*  MCV 88.8 89.0  PLT 843* 783*    Cardiac Enzymes: No results for input(s): CKTOTAL, CKMB, CKMBINDEX, TROPONINI in the last  168 hours.  BNP (last 3 results) No results for input(s): BNP in the last 8760 hours.  ProBNP (last 3 results) No results for input(s): PROBNP in the last 8760 hours.  Radiological Exams: No results found.  Assessment/Plan Active Problems:   Acute on chronic respiratory failure with hypoxia (HCC)   Chronic atrial fibrillation (HCC)   Acute systolic heart failure (HCC)   Tracheostomy care ([place])   Healthcare-associated pneumonia   HIT (heparin-induced thrombocytopenia) ([place])   1. Acute on chronic respiratory failure with hypoxia we will continue with assessing the spontaneous breathing trials.  Today did not do well.  Spoke with primary care team will going to try to diurese her a little bit more aggressively based on the low ejection fraction she is likely in some congestive heart failure.  We will get a follow-up chest x-ray ordered also. 2. Chronic atrial fibrillation currently rate is controlled we will continue with present management. 3. Acute systolic heart failure ejection fraction 35% we will continue with supportive care diuresis monitor labs. 4. HIT has recovered nicely 5. Healthcare associated pneumonia treated with antibiotics we will continue to follow and also do a follow-up chest x-ray as discussed. 6. Tracheostomy will need to remain in place long-term   I have personally seen and evaluated the patient, evaluated laboratory and imaging results, formulated the assessment and plan and placed orders. The Patient requires high complexity decision making for assessment  and support.  Case was discussed on Rounds with the Respiratory Therapy Staff time 35 minutes spent discussion with the primary care team review of the medical record  Allyne Gee, MD [facility] Pulmonary Critical Care Medicine Sleep Medicine

## 2018-07-25 ENCOUNTER — Other Ambulatory Visit (HOSPITAL_COMMUNITY): Payer: Self-pay

## 2018-07-25 DIAGNOSIS — I482 Chronic atrial fibrillation: Secondary | ICD-10-CM | POA: Diagnosis not present

## 2018-07-25 DIAGNOSIS — J189 Pneumonia, unspecified organism: Secondary | ICD-10-CM | POA: Diagnosis not present

## 2018-07-25 DIAGNOSIS — I5021 Acute systolic (congestive) heart failure: Secondary | ICD-10-CM | POA: Diagnosis not present

## 2018-07-25 DIAGNOSIS — J9621 Acute and chronic respiratory failure with hypoxia: Secondary | ICD-10-CM | POA: Diagnosis not present

## 2018-07-25 LAB — BASIC METABOLIC PANEL
Anion gap: 9 (ref 5–15)
BUN: 21 mg/dL — ABNORMAL HIGH (ref 6–20)
CO2: 33 mmol/L — ABNORMAL HIGH (ref 22–32)
Calcium: 8.4 mg/dL — ABNORMAL LOW (ref 8.9–10.3)
Chloride: 96 mmol/L — ABNORMAL LOW (ref 98–111)
Creatinine, Ser: 0.71 mg/dL (ref 0.44–1.00)
GFR calc non Af Amer: >60 mL/min (ref >60)
Glucose, Bld: 110 mg/dL — ABNORMAL HIGH (ref 70–99)
POTASSIUM: 4.2 mmol/L (ref 3.5–5.1)
SODIUM: 138 mmol/L (ref 135–145)

## 2018-07-25 LAB — CBC
HCT: 25.3 % — ABNORMAL LOW (ref 36.0–46.0)
HEMOGLOBIN: 7.7 g/dL — AB (ref 12.0–15.0)
MCH: 26.6 pg (ref 26.0–34.0)
MCHC: 30.4 g/dL (ref 30.0–36.0)
MCV: 87.2 fL (ref 78.0–100.0)
Platelets: 837 10*3/uL — ABNORMAL HIGH (ref 150–400)
RBC: 2.9 MIL/uL — AB (ref 3.87–5.11)
RDW: 17 % — ABNORMAL HIGH (ref 11.5–15.5)
WBC: 10.3 10*3/uL (ref 4.0–10.5)

## 2018-07-25 LAB — MAGNESIUM: Magnesium: 1.9 mg/dL (ref 1.7–2.4)

## 2018-07-25 NOTE — Progress Notes (Signed)
Pulmonary Critical Care Medicine [place]   PULMONARY CRITICAL CARE SERVICE  PROGRESS NOTE  Date of Service: 07/25/2018  Shannon Roman  TZG:[id number]  DOB: 03-Feb-1956   DOA: 07/20/2018  Referring Physician: Merton Border, MD  HPI: Shannon Roman is a 62 y.o. female seen for follow up of Acute on Chronic Respiratory Failure.  Patient is currently on the ventilator and full support has not been able to wean.  Chest x-ray shows diffuse pneumonia right greater than left.  Currently patient was on Rocephin.  Medications: Reviewed on Rounds  Physical Exam:  Vitals: Temperature is 98.2 pulse 76 respiratory 24 blood pressure 119/62 saturations 90%.  Ventilator Settings mode of ventilation assist control FiO2 45% tidal line 561 PEEP 5  . General: Comfortable at this time . Eyes: Grossly normal lids, irises & conjunctiva . ENT: grossly tongue is normal . Neck: no obvious mass . Cardiovascular: S1 S2 normal no gallop . Respiratory: Very coarse . Abdomen: soft . Skin: no rash seen on limited exam . Musculoskeletal: not rigid . Psychiatric:unable to assess . Neurologic: no seizure no involuntary movements         Lab Data:   Basic Metabolic Panel: Recent Labs  Lab 07/21/18 0502 07/22/18 0731 07/25/18 0546  NA 137 135 138  K 3.5 4.4 4.2  CL 101 99 96*  CO2 27 27 33*  GLUCOSE 107* 122* 110*  BUN 19 17 21*  CREATININE 0.68 0.66 0.71  CALCIUM 8.3* 8.2* 8.4*  MG  --  1.7 1.9  PHOS  --  4.8*  --     Liver Function Tests: No results for input(s): AST, ALT, ALKPHOS, BILITOT, PROT, ALBUMIN in the last 168 hours. No results for input(s): LIPASE, AMYLASE in the last 168 hours. No results for input(s): AMMONIA in the last 168 hours.  CBC: Recent Labs  Lab 07/21/18 0502 07/22/18 0731 07/25/18 0546  WBC 17.0* 17.0* 10.3  NEUTROABS 13.4* 13.7*  --   HGB 8.7* 8.1* 7.7*  HCT 28.4* 26.8* 25.3*  MCV 88.8 89.0 87.2  PLT 843* 783* 837*    Cardiac  Enzymes: No results for input(s): CKTOTAL, CKMB, CKMBINDEX, TROPONINI in the last 168 hours.  BNP (last 3 results) No results for input(s): BNP in the last 8760 hours.  ProBNP (last 3 results) No results for input(s): PROBNP in the last 8760 hours.  Radiological Exams: Dg Chest Port 1 View  Result Date: 07/25/2018 CLINICAL DATA:  : Respiratory failure/trach,vent EXAM: PORTABLE CHEST 1 VIEW COMPARISON:  07/21/2018 FINDINGS: Tracheostomy tube overlies normal cardiac silhouette. There is bilateral asymmetric airspace disease greater on the RIGHT. This airspace disease is diffuse and not improved. Bilateral pleural effusions. IMPRESSION: No change in asymmetric airspace disease greater on the RIGHT suggesting asymmetric pulmonary edema. Small effusions. Electronically Signed   By: Suzy Bouchard M.D.   On: 07/25/2018 12:29    Assessment/Plan Active Problems:   Acute on chronic respiratory failure with hypoxia (HCC)   Chronic atrial fibrillation (HCC)   Acute systolic heart failure (HCC)   Tracheostomy care ([place])   Healthcare-associated pneumonia   HIT (heparin-induced thrombocytopenia) ([place])   1. Acute on chronic respiratory failure with hypoxia at this time patient is not able to wean because of the pneumonitis.  Chest x-ray results were reviewed during rounds.  Patient is currently on Rocephin I would recommend broadening the coverage at this stage for healthcare associated pneumonia. 2. Chronic atrial fibrillation rate is controlled we will continue to monitor.  3. Acute systolic heart failure is compensated at this time 4. Healthcare associated pneumonia as discussed above broaden coverage. 5. 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 The Patient requires high complexity decision making for assessment and support.   Case was discussed on Rounds with the Respiratory Therapy Staff  Allyne Gee, MD [facility] Pulmonary Critical Care Medicine Sleep Medicine

## 2018-07-26 DIAGNOSIS — I482 Chronic atrial fibrillation: Secondary | ICD-10-CM | POA: Diagnosis not present

## 2018-07-26 DIAGNOSIS — J9621 Acute and chronic respiratory failure with hypoxia: Secondary | ICD-10-CM | POA: Diagnosis not present

## 2018-07-26 DIAGNOSIS — J189 Pneumonia, unspecified organism: Secondary | ICD-10-CM | POA: Diagnosis not present

## 2018-07-26 DIAGNOSIS — I5021 Acute systolic (congestive) heart failure: Secondary | ICD-10-CM | POA: Diagnosis not present

## 2018-07-26 NOTE — Progress Notes (Signed)
Pulmonary Critical Care Medicine [place]   PULMONARY CRITICAL CARE SERVICE  PROGRESS NOTE  Date of Service: 07/26/2018  Shannon Roman  [id number]  DOB: 1956-11-22   DOA: 07/20/2018  Referring Physician: Merton Border, MD  HPI: Shannon Roman is a 62 y.o. female seen for follow up of Acute on Chronic Respiratory Failure.  Patient is currently on weaning protocol.  Patient was able to do 2 hours on pressure support without any issues.  Medications: Reviewed on Rounds  Physical Exam:  Vitals: Temperature 96.4 pulse 78 respiratory 29 blood pressure 134/78 saturations 90%  Ventilator Settings mode of ventilation pressure support FiO2 30% pressure support 12 PEEP 5  . General: Comfortable at this time . Eyes: Grossly normal lids, irises & conjunctiva . ENT: grossly tongue is normal . Neck: no obvious mass . Cardiovascular: S1 S2 normal no gallop . Respiratory: Coarse rhonchi noted . Abdomen: soft . Skin: no rash seen on limited exam . Musculoskeletal: not rigid . Psychiatric:unable to assess . Neurologic: no seizure no involuntary movements         Lab Data:   Basic Metabolic Panel: Recent Labs  Lab 07/21/18 0502 07/22/18 0731 07/25/18 0546  NA 137 135 138  K 3.5 4.4 4.2  CL 101 99 96*  CO2 27 27 33*  GLUCOSE 107* 122* 110*  BUN 19 17 21*  CREATININE 0.68 0.66 0.71  CALCIUM 8.3* 8.2* 8.4*  MG  --  1.7 1.9  PHOS  --  4.8*  --     Liver Function Tests: No results for input(s): AST, ALT, ALKPHOS, BILITOT, PROT, ALBUMIN in the last 168 hours. No results for input(s): LIPASE, AMYLASE in the last 168 hours. No results for input(s): AMMONIA in the last 168 hours.  CBC: Recent Labs  Lab 07/21/18 0502 07/22/18 0731 07/25/18 0546  WBC 17.0* 17.0* 10.3  NEUTROABS 13.4* 13.7*  --   HGB 8.7* 8.1* 7.7*  HCT 28.4* 26.8* 25.3*  MCV 88.8 89.0 87.2  PLT 843* 783* 837*    Cardiac Enzymes: No results for input(s): CKTOTAL, CKMB, CKMBINDEX,  TROPONINI in the last 168 hours.  BNP (last 3 results) No results for input(s): BNP in the last 8760 hours.  ProBNP (last 3 results) No results for input(s): PROBNP in the last 8760 hours.  Radiological Exams: Dg Chest Port 1 View  Result Date: 07/25/2018 CLINICAL DATA:  : Respiratory failure/trach,vent EXAM: PORTABLE CHEST 1 VIEW COMPARISON:  07/21/2018 FINDINGS: Tracheostomy tube overlies normal cardiac silhouette. There is bilateral asymmetric airspace disease greater on the RIGHT. This airspace disease is diffuse and not improved. Bilateral pleural effusions. IMPRESSION: No change in asymmetric airspace disease greater on the RIGHT suggesting asymmetric pulmonary edema. Small effusions. Electronically Signed   By: Suzy Bouchard M.D.   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 Acute on chronic respiratory failure with hypoxia we will continue with weaning on pressure support as tolerated as mentioned patient was able to do 2 hours on pressure support we will continue with supportive care 2. Chronic atrial fibrillation rate is controlled at this time we will continue to monitor 3. Acute systolic heart failure we will continue with supportive care at this time. 4. Status post tracheostomy we will  continue present management 5. Healthcare associated pneumonia treated with antibiotics 6. HIT patient is doing better off heparin   I have personally seen and evaluated the patient, evaluated laboratory and imaging results, formulated the assessment and plan and placed orders. The Patient requires high complexity decision making for assessment and support.  Case was discussed on Rounds with the Respiratory Therapy Staff  Allyne Gee, MD [facility] Pulmonary Critical Care Medicine Sleep Medicine

## 2018-07-27 DIAGNOSIS — J189 Pneumonia, unspecified organism: Secondary | ICD-10-CM | POA: Diagnosis not present

## 2018-07-27 DIAGNOSIS — I482 Chronic atrial fibrillation: Secondary | ICD-10-CM | POA: Diagnosis not present

## 2018-07-27 DIAGNOSIS — I5021 Acute systolic (congestive) heart failure: Secondary | ICD-10-CM | POA: Diagnosis not present

## 2018-07-27 DIAGNOSIS — J9621 Acute and chronic respiratory failure with hypoxia: Secondary | ICD-10-CM | POA: Diagnosis not present

## 2018-07-27 LAB — BASIC METABOLIC PANEL
Anion gap: 5 (ref 5–15)
BUN: 32 mg/dL — ABNORMAL HIGH (ref 6–20)
CALCIUM: 8.5 mg/dL — AB (ref 8.9–10.3)
CO2: 35 mmol/L — ABNORMAL HIGH (ref 22–32)
CREATININE: 0.81 mg/dL (ref 0.44–1.00)
Chloride: 98 mmol/L (ref 98–111)
Glucose, Bld: 111 mg/dL — ABNORMAL HIGH (ref 70–99)
Potassium: 3.8 mmol/L (ref 3.5–5.1)
Sodium: 138 mmol/L (ref 135–145)

## 2018-07-27 NOTE — Progress Notes (Signed)
Pulmonary Critical Care Medicine [place]   PULMONARY CRITICAL CARE SERVICE  PROGRESS NOTE  Date of Service: 07/27/2018  Shannon Roman  [id number]  DOB: 19-Jul-1956   DOA: 07/20/2018  Referring Physician: Merton Border, MD  HPI: Shannon Roman is a 62 y.o. female seen for follow up of Acute on Chronic Respiratory Failure.  Patient remains on pressure support the goal is for 4 hours today so far tolerating well  Medications: Reviewed on Rounds  Physical Exam:  Vitals: Temperature 98.0 pulse 104 respiratory rate 23 blood pressure 125/57 saturations 98%  Ventilator Settings mode of ventilation pressure support FiO2 30% tidal volume 336 per support 12 PEEP 5  . General: Comfortable at this time . Eyes: Grossly normal lids, irises & conjunctiva . ENT: grossly tongue is normal . Neck: no obvious mass . Cardiovascular: S1 S2 normal no gallop . Respiratory: No rhonchi no rales . Abdomen: soft . Skin: no rash seen on limited exam . Musculoskeletal: not rigid . Psychiatric:unable to assess . Neurologic: no seizure no involuntary movements         Lab Data:   Basic Metabolic Panel: Recent Labs  Lab 07/21/18 0502 07/22/18 0731 07/25/18 0546 07/27/18 0518  NA 137 135 138 138  K 3.5 4.4 4.2 3.8  CL 101 99 96* 98  CO2 27 27 33* 35*  GLUCOSE 107* 122* 110* 111*  BUN 19 17 21* 32*  CREATININE 0.68 0.66 0.71 0.81  CALCIUM 8.3* 8.2* 8.4* 8.5*  MG  --  1.7 1.9  --   PHOS  --  4.8*  --   --     Liver Function Tests: No results for input(s): AST, ALT, ALKPHOS, BILITOT, PROT, ALBUMIN in the last 168 hours. No results for input(s): LIPASE, AMYLASE in the last 168 hours. No results for input(s): AMMONIA in the last 168 hours.  CBC: Recent Labs  Lab 07/21/18 0502 07/22/18 0731 07/25/18 0546  WBC 17.0* 17.0* 10.3  NEUTROABS 13.4* 13.7*  --   HGB 8.7* 8.1* 7.7*  HCT 28.4* 26.8* 25.3*  MCV 88.8 89.0 87.2  PLT 843* 783* 837*    Cardiac  Enzymes: No results for input(s): CKTOTAL, CKMB, CKMBINDEX, TROPONINI in the last 168 hours.  BNP (last 3 results) No results for input(s): BNP in the last 8760 hours.  ProBNP (last 3 results) No results for input(s): PROBNP in the last 8760 hours.  Radiological Exams: No results found.  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 Acute on chronic respiratory failure with hypoxia we will continue with the wean on pressure support the goal is for 4 hours 2. Chronic atrial fibrillation rate controlled 3. Acute systolic heart failure currently compensated monitor fluids 4. Healthcare associated pneumonia treated with antibiotics 5. HIT platelets have been improved and stable   I have personally seen and evaluated the patient, evaluated laboratory and imaging results, formulated the assessment and plan and placed orders. The Patient requires high complexity decision making for assessment and support.  Case was discussed on Rounds with the Respiratory Therapy Staff  Allyne Gee, MD [facility] Pulmonary Critical Care Medicine Sleep Medicine

## 2018-07-28 DIAGNOSIS — J189 Pneumonia, unspecified organism: Secondary | ICD-10-CM | POA: Diagnosis not present

## 2018-07-28 DIAGNOSIS — J9621 Acute and chronic respiratory failure with hypoxia: Secondary | ICD-10-CM | POA: Diagnosis not present

## 2018-07-28 DIAGNOSIS — I5021 Acute systolic (congestive) heart failure: Secondary | ICD-10-CM | POA: Diagnosis not present

## 2018-07-28 DIAGNOSIS — I482 Chronic atrial fibrillation: Secondary | ICD-10-CM | POA: Diagnosis not present

## 2018-07-28 NOTE — Progress Notes (Signed)
Pulmonary Critical Care Medicine [place]   PULMONARY CRITICAL CARE SERVICE  PROGRESS NOTE  Date of Service: 07/28/2018  IISHA SOYARS  [id number]  DOB: 1955-12-26   DOA: 07/20/2018  Referring Physician: Merton Border, MD  HPI: KENDYLL HUETTNER is a 62 y.o. female seen for follow up of Acute on Chronic Respiratory Failure.  Patient is on pressure support mode has been weaning the goal is for up to 12 hours  Medications: Reviewed on Rounds  Physical Exam:  Vitals: Temperature 97.6 pulse 68 respiratory rate 20 blood pressure 106/55 saturations 98%  Ventilator Settings mode of ventilation pressure support FiO2 20% tidal volume 450 pressure support 12 PEEP 5  . General: Comfortable at this time . Eyes: Grossly normal lids, irises & conjunctiva . ENT: grossly tongue is normal . Neck: no obvious mass . Cardiovascular: S1 S2 normal no gallop . Respiratory: Coarse rhonchi noted bilaterally . Abdomen: soft . Skin: no rash seen on limited exam . Musculoskeletal: not rigid . Psychiatric:unable to assess . Neurologic: no seizure no involuntary movements         Lab Data:   Basic Metabolic Panel: Recent Labs  Lab 07/22/18 0731 07/25/18 0546 07/27/18 0518  NA 135 138 138  K 4.4 4.2 3.8  CL 99 96* 98  CO2 27 33* 35*  GLUCOSE 122* 110* 111*  BUN 17 21* 32*  CREATININE 0.66 0.71 0.81  CALCIUM 8.2* 8.4* 8.5*  MG 1.7 1.9  --   PHOS 4.8*  --   --     ABG: No results for input(s): PHART, PCO2ART, PO2ART, HCO3, O2SAT in the last 168 hours.  Liver Function Tests: No results for input(s): AST, ALT, ALKPHOS, BILITOT, PROT, ALBUMIN in the last 168 hours. No results for input(s): LIPASE, AMYLASE in the last 168 hours. No results for input(s): AMMONIA in the last 168 hours.  CBC: Recent Labs  Lab 07/22/18 0731 07/25/18 0546  WBC 17.0* 10.3  NEUTROABS 13.7*  --   HGB 8.1* 7.7*  HCT 26.8* 25.3*  MCV 89.0 87.2  PLT 783* 837*    Cardiac  Enzymes: No results for input(s): CKTOTAL, CKMB, CKMBINDEX, TROPONINI in the last 168 hours.  BNP (last 3 results) No results for input(s): BNP in the last 8760 hours.  ProBNP (last 3 results) No results for input(s): PROBNP in the last 8760 hours.  Radiological Exams: No results found.  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 Acute on chronic respiratory failure with hypoxia continue to wean on pressure support as tolerated the goal is 12 hours 2. Chronic atrial fibrillation rate is controlled we will continue with present management. 3. Acute systolic heart failure compensated continue with supportive care. 4. Healthcare associated pneumonia treated we will continue present management. 5. HIT platelets have been stable   I have personally seen and evaluated the patient, evaluated laboratory and imaging results, formulated the assessment and plan and placed orders. The Patient requires high complexity decision making for assessment and support.  Case was discussed on Rounds with the Respiratory Therapy Staff  Allyne Gee, MD [facility] Pulmonary Critical Care Medicine Sleep Medicine

## 2018-07-29 DIAGNOSIS — I5021 Acute systolic (congestive) heart failure: Secondary | ICD-10-CM | POA: Diagnosis not present

## 2018-07-29 DIAGNOSIS — J9621 Acute and chronic respiratory failure with hypoxia: Secondary | ICD-10-CM | POA: Diagnosis not present

## 2018-07-29 DIAGNOSIS — I482 Chronic atrial fibrillation: Secondary | ICD-10-CM | POA: Diagnosis not present

## 2018-07-29 DIAGNOSIS — J189 Pneumonia, unspecified organism: Secondary | ICD-10-CM | POA: Diagnosis not present

## 2018-07-29 LAB — CBC WITH DIFFERENTIAL/PLATELET
Abs Immature Granulocytes: 0.1 10*3/uL (ref 0.0–0.1)
BASOS ABS: 0.1 10*3/uL (ref 0.0–0.1)
BASOS PCT: 1 %
EOS ABS: 1.1 10*3/uL — AB (ref 0.0–0.7)
EOS PCT: 11 %
HCT: 25.7 % — ABNORMAL LOW (ref 36.0–46.0)
Hemoglobin: 7.6 g/dL — ABNORMAL LOW (ref 12.0–15.0)
Immature Granulocytes: 1 %
LYMPHS PCT: 16 %
Lymphs Abs: 1.7 10*3/uL (ref 0.7–4.0)
MCH: 26.2 pg (ref 26.0–34.0)
MCHC: 29.6 g/dL — AB (ref 30.0–36.0)
MCV: 88.6 fL (ref 78.0–100.0)
MONO ABS: 0.8 10*3/uL (ref 0.1–1.0)
Monocytes Relative: 8 %
NEUTROS ABS: 6.6 10*3/uL (ref 1.7–7.7)
Neutrophils Relative %: 63 %
PLATELETS: 736 10*3/uL — AB (ref 150–400)
RBC: 2.9 MIL/uL — ABNORMAL LOW (ref 3.87–5.11)
RDW: 16.8 % — ABNORMAL HIGH (ref 11.5–15.5)
WBC: 10.4 10*3/uL (ref 4.0–10.5)

## 2018-07-29 LAB — BASIC METABOLIC PANEL
ANION GAP: 10 (ref 5–15)
BUN: 42 mg/dL — ABNORMAL HIGH (ref 6–20)
CHLORIDE: 99 mmol/L (ref 98–111)
CO2: 30 mmol/L (ref 22–32)
Calcium: 8.7 mg/dL — ABNORMAL LOW (ref 8.9–10.3)
Creatinine, Ser: 0.94 mg/dL (ref 0.44–1.00)
GFR calc Af Amer: >60 mL/min (ref >60)
Glucose, Bld: 106 mg/dL — ABNORMAL HIGH (ref 70–99)
POTASSIUM: 4 mmol/L (ref 3.5–5.1)
SODIUM: 139 mmol/L (ref 135–145)

## 2018-07-29 NOTE — Progress Notes (Signed)
Pulmonary Critical Care Medicine [place]   PULMONARY CRITICAL CARE SERVICE  PROGRESS NOTE  Date of Service: 07/29/2018  Shannon Roman  [id number]  DOB: July 26, 1956   DOA: 07/20/2018  Referring Physician: Merton Border, MD  HPI: Shannon Roman is a 62 y.o. female seen for follow up of Acute on Chronic Respiratory Failure.  She is weaning on pressure support the goal is for 16 hours today looks good so far patient has been on 28% FiO2  Medications: Reviewed on Rounds  Physical Exam:  Vitals: Temperature 97.1 pulse 64 respiratory rate 21 blood pressure 130/59 saturations 99%  Ventilator Settings mode of ventilation pressure support FiO2 28% tidal volume 503 pressure support 12 PEEP 5  . General: Comfortable at this time . Eyes: Grossly normal lids, irises & conjunctiva . ENT: grossly tongue is normal . Neck: no obvious mass . Cardiovascular: S1 S2 normal no gallop . Respiratory: Few distant rhonchi are noted . Abdomen: soft . Skin: no rash seen on limited exam . Musculoskeletal: not rigid . Psychiatric:unable to assess . Neurologic: no seizure no involuntary movements         Lab Data:   Basic Metabolic Panel: Recent Labs  Lab 07/25/18 0546 07/27/18 0518 07/29/18 0711  NA 138 138 139  K 4.2 3.8 4.0  CL 96* 98 99  CO2 33* 35* 30  GLUCOSE 110* 111* 106*  BUN 21* 32* 42*  CREATININE 0.71 0.81 0.94  CALCIUM 8.4* 8.5* 8.7*  MG 1.9  --   --     ABG: No results for input(s): PHART, PCO2ART, PO2ART, HCO3, O2SAT in the last 168 hours.  Liver Function Tests: No results for input(s): AST, ALT, ALKPHOS, BILITOT, PROT, ALBUMIN in the last 168 hours. No results for input(s): LIPASE, AMYLASE in the last 168 hours. No results for input(s): AMMONIA in the last 168 hours.  CBC: Recent Labs  Lab 07/25/18 0546 07/29/18 0711  WBC 10.3 10.4  NEUTROABS  --  6.6  HGB 7.7* 7.6*  HCT 25.3* 25.7*  MCV 87.2 88.6  PLT 837* 736*    Cardiac  Enzymes: No results for input(s): CKTOTAL, CKMB, CKMBINDEX, TROPONINI in the last 168 hours.  BNP (last 3 results) No results for input(s): BNP in the last 8760 hours.  ProBNP (last 3 results) No results for input(s): PROBNP in the last 8760 hours.  Radiological Exams: No results found.  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 Acute on chronic respiratory failure with hypoxia continue with pressure support as mentioned above the goal is for 16 hours will advance to 20 tomorrow 2. Chronic atrial fibrillation rate is controlled 3. Acute systolic heart failure compensated at this time monitor fluid status. 4. Tracheostomy we will continue with present management. 5. Healthcare associated pneumonia treated we will follow 6. HIT syndrome stable we will continue with present management   I have personally seen and evaluated the patient, evaluated laboratory and imaging results, formulated the assessment and plan and placed orders. The Patient requires high complexity decision making for assessment and support.  Case was discussed on Rounds with the Respiratory Therapy Staff  Allyne Gee, MD [facility] Pulmonary Critical Care Medicine Sleep Medicine

## 2018-07-30 DIAGNOSIS — I482 Chronic atrial fibrillation: Secondary | ICD-10-CM | POA: Diagnosis not present

## 2018-07-30 DIAGNOSIS — I5021 Acute systolic (congestive) heart failure: Secondary | ICD-10-CM | POA: Diagnosis not present

## 2018-07-30 DIAGNOSIS — J9621 Acute and chronic respiratory failure with hypoxia: Secondary | ICD-10-CM | POA: Diagnosis not present

## 2018-07-30 DIAGNOSIS — J189 Pneumonia, unspecified organism: Secondary | ICD-10-CM | POA: Diagnosis not present

## 2018-07-30 NOTE — Progress Notes (Signed)
Pulmonary Critical Care Medicine [place]   PULMONARY CRITICAL CARE SERVICE  PROGRESS NOTE  Date of Service: 07/30/2018  Shannon Roman  [id number]  DOB: [birth year]   DOA: 07/20/2018  Referring Physician: Merton Border, MD  HPI: Shannon Roman is a 62 y.o. female seen for follow up of Acute on Chronic Respiratory Failure.  Patient is on T collar right now has been on 28% FiO2 the goal is for 4 hours  Medications: Reviewed on Rounds  Physical Exam:  Vitals: Temperature 96.5 pulse 74 respiratory 25 blood pressure 112/62 saturations 99%  Ventilator Settings off the ventilator on T collar FiO2 28%  . General: Comfortable at this time . Eyes: Grossly normal lids, irises & conjunctiva . ENT: grossly tongue is normal . Neck: no obvious mass . Cardiovascular: S1 S2 normal no gallop . Respiratory: Scattered rhonchi are noted . Abdomen: soft . Skin: no rash seen on limited exam . Musculoskeletal: not rigid . Psychiatric:unable to assess . Neurologic: no seizure no involuntary movements         Lab Data:   Basic Metabolic Panel: Recent Labs  Lab 07/25/18 0546 07/27/18 0518 07/29/18 0711  NA 138 138 139  K 4.2 3.8 4.0  CL 96* 98 99  CO2 33* 35* 30  GLUCOSE 110* 111* 106*  BUN 21* 32* 42*  CREATININE 0.71 0.81 0.94  CALCIUM 8.4* 8.5* 8.7*  MG 1.9  --   --     ABG: No results for input(s): PHART, PCO2ART, PO2ART, HCO3, O2SAT in the last 168 hours.  Liver Function Tests: No results for input(s): AST, ALT, ALKPHOS, BILITOT, PROT, ALBUMIN in the last 168 hours. No results for input(s): LIPASE, AMYLASE in the last 168 hours. No results for input(s): AMMONIA in the last 168 hours.  CBC: Recent Labs  Lab 07/25/18 0546 07/29/18 0711  WBC 10.3 10.4  NEUTROABS  --  6.6  HGB 7.7* 7.6*  HCT 25.3* 25.7*  MCV 87.2 88.6  PLT 837* 736*    Cardiac Enzymes: No results for input(s): CKTOTAL, CKMB, CKMBINDEX, TROPONINI in the last 168 hours.  BNP  (last 3 results) No results for input(s): BNP in the last 8760 hours.  ProBNP (last 3 results) No results for input(s): PROBNP in the last 8760 hours.  Radiological Exams: No results found.  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 Acute on chronic respiratory failure with hypoxia continue to wean on T collar she is doing little bit better today as mentioned 4-hour goal 2. Acute systolic heart failure compensated 3. Tracheostomy care will continue with present management 4. Healthcare associated pneumonia treated we will continue to follow-up x-ray as needed 5. HIT syndrome stable monitor platelets   I have personally seen and evaluated the patient, evaluated laboratory and imaging results, formulated the assessment and plan and placed orders. The Patient requires high complexity decision making for assessment and support.  Case was discussed on Rounds with the Respiratory Therapy Staff  Allyne Gee, MD [facility] Pulmonary Critical Care Medicine Sleep Medicine

## 2018-07-31 DIAGNOSIS — J9621 Acute and chronic respiratory failure with hypoxia: Secondary | ICD-10-CM | POA: Diagnosis not present

## 2018-07-31 DIAGNOSIS — J189 Pneumonia, unspecified organism: Secondary | ICD-10-CM | POA: Diagnosis not present

## 2018-07-31 DIAGNOSIS — I482 Chronic atrial fibrillation: Secondary | ICD-10-CM | POA: Diagnosis not present

## 2018-07-31 DIAGNOSIS — I5021 Acute systolic (congestive) heart failure: Secondary | ICD-10-CM | POA: Diagnosis not present

## 2018-07-31 NOTE — Progress Notes (Signed)
Pulmonary Critical Care Medicine [place]   PULMONARY CRITICAL CARE SERVICE  PROGRESS NOTE  Date of Service: 07/31/2018  Shannon Roman  [id number]  DOB: 03-15-1956   DOA: 07/20/2018  Referring Physician: Merton Border, MD  HPI: Shannon Roman is a 62 y.o. female seen for follow up of Acute on Chronic Respiratory Failure.  Patient is doing a little bit better has been weaning on T collar looks good right now is on 28% FiO2  Medications: Reviewed on Rounds  Physical Exam:  Vitals: Temperature 98.4 pulse 63 respiratory rate 26 blood pressure 122/61 saturations 100%  Ventilator Settings off the ventilator on T collar FiO2 28%  . General: Comfortable at this time . Eyes: Grossly normal lids, irises & conjunctiva . ENT: grossly tongue is normal . Neck: no obvious mass . Cardiovascular: S1 S2 normal no gallop . Respiratory: Scattered rhonchi are noted . Abdomen: soft . Skin: no rash seen on limited exam . Musculoskeletal: not rigid . Psychiatric:unable to assess . Neurologic: no seizure no involuntary movements         Lab Data:   Basic Metabolic Panel: Recent Labs  Lab 07/25/18 0546 07/27/18 0518 07/29/18 0711  NA 138 138 139  K 4.2 3.8 4.0  CL 96* 98 99  CO2 33* 35* 30  GLUCOSE 110* 111* 106*  BUN 21* 32* 42*  CREATININE 0.71 0.81 0.94  CALCIUM 8.4* 8.5* 8.7*  MG 1.9  --   --     ABG: No results for input(s): PHART, PCO2ART, PO2ART, HCO3, O2SAT in the last 168 hours.  Liver Function Tests: No results for input(s): AST, ALT, ALKPHOS, BILITOT, PROT, ALBUMIN in the last 168 hours. No results for input(s): LIPASE, AMYLASE in the last 168 hours. No results for input(s): AMMONIA in the last 168 hours.  CBC: Recent Labs  Lab 07/25/18 0546 07/29/18 0711  WBC 10.3 10.4  NEUTROABS  --  6.6  HGB 7.7* 7.6*  HCT 25.3* 25.7*  MCV 87.2 88.6  PLT 837* 736*    Cardiac Enzymes: No results for input(s): CKTOTAL, CKMB, CKMBINDEX, TROPONINI  in the last 168 hours.  BNP (last 3 results) No results for input(s): BNP in the last 8760 hours.  ProBNP (last 3 results) No results for input(s): PROBNP in the last 8760 hours.  Radiological Exams: No results found.  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 Acute on chronic respiratory failure with hypoxia patient remains on T collar will continue to wean 8 hours a day.  Continue pulmonary toilet secretion management. 2. Chronic atrial fibrillation rate is controlled we will follow 3. Acute systolic heart failure at baseline 4. Healthcare associated pneumonia treated we will continue to follow. 5. HIT syndrome platelets have been stable   I have personally seen and evaluated the patient, evaluated laboratory and imaging results, formulated the assessment and plan and placed orders. The Patient requires high complexity decision making for assessment and support.  Case was discussed on Rounds with the Respiratory Therapy Staff  Allyne Gee, MD [facility] Pulmonary Critical Care Medicine Sleep Medicine

## 2018-08-01 ENCOUNTER — Other Ambulatory Visit (HOSPITAL_COMMUNITY): Payer: Self-pay

## 2018-08-01 DIAGNOSIS — J9621 Acute and chronic respiratory failure with hypoxia: Secondary | ICD-10-CM | POA: Diagnosis not present

## 2018-08-01 DIAGNOSIS — I482 Chronic atrial fibrillation: Secondary | ICD-10-CM | POA: Diagnosis not present

## 2018-08-01 DIAGNOSIS — I5021 Acute systolic (congestive) heart failure: Secondary | ICD-10-CM | POA: Diagnosis not present

## 2018-08-01 DIAGNOSIS — J189 Pneumonia, unspecified organism: Secondary | ICD-10-CM | POA: Diagnosis not present

## 2018-08-01 HISTORY — PX: IR REPLC GASTRO/COLONIC TUBE PERCUT W/FLUORO: IMG2333

## 2018-08-01 LAB — RENAL FUNCTION PANEL
ALBUMIN: 2 g/dL — AB (ref 3.5–5.0)
ANION GAP: 8 (ref 5–15)
BUN: 48 mg/dL — ABNORMAL HIGH (ref 6–20)
CO2: 31 mmol/L (ref 22–32)
CREATININE: 1.08 mg/dL — AB (ref 0.44–1.00)
Calcium: 9 mg/dL (ref 8.9–10.3)
Chloride: 103 mmol/L (ref 98–111)
GFR calc non Af Amer: 55 mL/min — ABNORMAL LOW (ref >60)
GLUCOSE: 103 mg/dL — AB (ref 70–99)
PHOSPHORUS: 3.9 mg/dL (ref 2.5–4.6)
Potassium: 3.7 mmol/L (ref 3.5–5.1)
Sodium: 142 mmol/L (ref 135–145)

## 2018-08-01 LAB — CBC
HEMATOCRIT: 23.3 % — AB (ref 36.0–46.0)
HEMOGLOBIN: 7 g/dL — AB (ref 12.0–15.0)
MCH: 26.7 pg (ref 26.0–34.0)
MCHC: 30 g/dL (ref 30.0–36.0)
MCV: 88.9 fL (ref 78.0–100.0)
Platelets: 491 10*3/uL — ABNORMAL HIGH (ref 150–400)
RBC: 2.62 MIL/uL — ABNORMAL LOW (ref 3.87–5.11)
RDW: 17.2 % — ABNORMAL HIGH (ref 11.5–15.5)
WBC: 12.1 10*3/uL — AB (ref 4.0–10.5)

## 2018-08-01 LAB — MAGNESIUM: MAGNESIUM: 2.3 mg/dL (ref 1.7–2.4)

## 2018-08-01 MED ORDER — IOPAMIDOL (ISOVUE-300) INJECTION 61%
INTRAVENOUS | Status: AC
  Administered 2018-08-01: 10 mL
  Filled 2018-08-01: qty 50

## 2018-08-01 MED ORDER — LIDOCAINE VISCOUS HCL 2 % MT SOLN
OROMUCOSAL | Status: AC
  Filled 2018-08-01: qty 15

## 2018-08-01 NOTE — Progress Notes (Signed)
Pulmonary Critical Care Medicine [place]   PULMONARY CRITICAL CARE SERVICE  PROGRESS NOTE  Date of Service: 08/01/2018  FRANCELLA BARNETT  [id number]:[id number]  DOB: 1956/02/26   DOA: 07/20/2018  Referring Physician: Merton Border, MD  HPI: Shannon Roman is a 62 y.o. female seen for follow up of Acute on Chronic Respiratory Failure.  Patient is on T collar goal is for about 12-hour weaning today so far looks good  Medications: Reviewed on Rounds  Physical Exam:  Vitals: Temperature is 97.4 pulse 64 respiratory 18 blood pressure 142/66 saturations 100%  Ventilator Settings currently is on T collar 28% FiO2  . General: Comfortable at this time . Eyes: Grossly normal lids, irises & conjunctiva . ENT: grossly tongue is normal . Neck: no obvious mass . Cardiovascular: S1 S2 normal no gallop . Respiratory: No rhonchi or rales are noted . Abdomen: soft . Skin: no rash seen on limited exam . Musculoskeletal: not rigid . Psychiatric:unable to assess . Neurologic: no seizure no involuntary movements         Lab Data:   Basic Metabolic Panel: Recent Labs  Lab 07/27/18 0518 07/29/18 0711 08/01/18 1109  NA 138 139 142  K 3.8 4.0 3.7  CL 98 99 103  CO2 35* 30 31  GLUCOSE 111* 106* 103*  BUN 32* 42* 48*  CREATININE 0.81 0.94 1.08*  CALCIUM 8.5* 8.7* 9.0  MG  --   --  2.3  PHOS  --   --  3.9    ABG: No results for input(s): PHART, PCO2ART, PO2ART, HCO3, O2SAT in the last 168 hours.  Liver Function Tests: Recent Labs  Lab 08/01/18 1109  ALBUMIN 2.0*   No results for input(s): LIPASE, AMYLASE in the last 168 hours. No results for input(s): AMMONIA in the last 168 hours.  CBC: Recent Labs  Lab 07/29/18 0711 08/01/18 1109  WBC 10.4 12.1*  NEUTROABS 6.6  --   HGB 7.6* 7.0*  HCT 25.7* 23.3*  MCV 88.6 88.9  PLT 736* 491*    Cardiac Enzymes: No results for input(s): CKTOTAL, CKMB, CKMBINDEX, TROPONINI in the last 168 hours.  BNP (last 3  results) No results for input(s): BNP in the last 8760 hours.  ProBNP (last 3 results) No results for input(s): PROBNP in the last 8760 hours.  Radiological Exams: No results found.  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 Acute on chronic respiratory failure with hypoxia we will continue with weaning on T collar as mentioned the goal is 12 hours 2. Chronic atrial fibrillation rate is controlled 3. Acute systolic heart failure baseline continue present management 4. Healthcare associated pneumonia treated we will follow 5. HIT syndrome we will continue with present supportive care platelets are stable   I have personally seen and evaluated the patient, evaluated laboratory and imaging results, formulated the assessment and plan and placed orders. The Patient requires high complexity decision making for assessment and support.  Case was discussed on Rounds with the Respiratory Therapy Staff  Allyne Gee, MD [facility] Pulmonary Critical Care Medicine Sleep Medicine

## 2018-08-01 NOTE — Procedures (Signed)
Pre procedural Dx: Dysphagia Post procedural Dx: Same  Successful fluoroscopic guided replacement of exisitng 20 Fr gastrostomy tube.   The feeding tube is ready for immediate use.  EBL: None  Complications: None immediate.  Ronny Bacon, MD Pager #: [phone]

## 2018-08-02 DIAGNOSIS — I482 Chronic atrial fibrillation: Secondary | ICD-10-CM | POA: Diagnosis not present

## 2018-08-02 DIAGNOSIS — J9621 Acute and chronic respiratory failure with hypoxia: Secondary | ICD-10-CM | POA: Diagnosis not present

## 2018-08-02 DIAGNOSIS — J189 Pneumonia, unspecified organism: Secondary | ICD-10-CM | POA: Diagnosis not present

## 2018-08-02 DIAGNOSIS — I5021 Acute systolic (congestive) heart failure: Secondary | ICD-10-CM | POA: Diagnosis not present

## 2018-08-02 LAB — PROTIME-INR
INR: 1.18
Prothrombin Time: 14.9 seconds (ref 11.4–15.2)

## 2018-08-02 LAB — RENAL FUNCTION PANEL
ALBUMIN: 2 g/dL — AB (ref 3.5–5.0)
Anion gap: 10 (ref 5–15)
BUN: 46 mg/dL — AB (ref 6–20)
CO2: 27 mmol/L (ref 22–32)
Calcium: 8.9 mg/dL (ref 8.9–10.3)
Chloride: 104 mmol/L (ref 98–111)
Creatinine, Ser: 1.14 mg/dL — ABNORMAL HIGH (ref 0.44–1.00)
GFR calc Af Amer: 59 mL/min — ABNORMAL LOW (ref >60)
GFR calc non Af Amer: 51 mL/min — ABNORMAL LOW (ref >60)
GLUCOSE: 127 mg/dL — AB (ref 70–99)
PHOSPHORUS: 4.1 mg/dL (ref 2.5–4.6)
POTASSIUM: 3.9 mmol/L (ref 3.5–5.1)
Sodium: 141 mmol/L (ref 135–145)

## 2018-08-02 LAB — CBC
HEMATOCRIT: 23.6 % — AB (ref 36.0–46.0)
Hemoglobin: 7 g/dL — ABNORMAL LOW (ref 12.0–15.0)
MCH: 26.7 pg (ref 26.0–34.0)
MCHC: 29.7 g/dL — AB (ref 30.0–36.0)
MCV: 90.1 fL (ref 78.0–100.0)
Platelets: 557 10*3/uL — ABNORMAL HIGH (ref 150–400)
RBC: 2.62 MIL/uL — ABNORMAL LOW (ref 3.87–5.11)
RDW: 17.7 % — AB (ref 11.5–15.5)
WBC: 9.9 10*3/uL (ref 4.0–10.5)

## 2018-08-02 LAB — C DIFFICILE QUICK SCREEN W PCR REFLEX
C DIFFICILE (CDIFF) INTERP: NOT DETECTED
C DIFFICILE (CDIFF) TOXIN: NEGATIVE
C Diff antigen: NEGATIVE

## 2018-08-02 LAB — MAGNESIUM: Magnesium: 2.3 mg/dL (ref 1.7–2.4)

## 2018-08-02 LAB — OCCULT BLOOD X 1 CARD TO LAB, STOOL: Fecal Occult Bld: POSITIVE — AB

## 2018-08-02 LAB — ABO/RH: ABO/RH(D): A NEG

## 2018-08-02 LAB — PREPARE RBC (CROSSMATCH)

## 2018-08-02 NOTE — Progress Notes (Signed)
Pulmonary Critical Care Medicine [place]   PULMONARY CRITICAL CARE SERVICE  PROGRESS NOTE  Date of Service: 08/02/2018  Shannon Roman  [id number]:[id number]  DOB: 1956-05-02   DOA: 07/20/2018  Referring Physician: Merton Border, MD  HPI: Shannon Roman is a 62 y.o. female seen for follow up of Acute on Chronic Respiratory Failure.  Patient is currently on T collar has been tolerating 28% oxygen goal is about 16 hours  Medications: Reviewed on Rounds  Physical Exam:  Vitals: Temperature 97.3 pulse 77 respiratory rate 18 blood pressure 130/69 saturations 98%  Ventilator Settings off the ventilator on 10 collar  . General: Comfortable at this time . Eyes: Grossly normal lids, irises & conjunctiva . ENT: grossly tongue is normal . Neck: no obvious mass . Cardiovascular: S1 S2 normal no gallop . Respiratory: Good aeration no rhonchi . Abdomen: soft . Skin: no rash seen on limited exam . Musculoskeletal: not rigid . Psychiatric:unable to assess . Neurologic: no seizure no involuntary movements         Lab Data:   Basic Metabolic Panel: Recent Labs  Lab 07/27/18 0518 07/29/18 0711 08/01/18 1109 08/02/18 0452  NA 138 139 142 141  K 3.8 4.0 3.7 3.9  CL 98 99 103 104  CO2 35* 30 31 27   GLUCOSE 111* 106* 103* 127*  BUN 32* 42* 48* 46*  CREATININE 0.81 0.94 1.08* 1.14*  CALCIUM 8.5* 8.7* 9.0 8.9  MG  --   --  2.3 2.3  PHOS  --   --  3.9 4.1    ABG: No results for input(s): PHART, PCO2ART, PO2ART, HCO3, O2SAT in the last 168 hours.  Liver Function Tests: Recent Labs  Lab 08/01/18 1109 08/02/18 0452  ALBUMIN 2.0* 2.0*   No results for input(s): LIPASE, AMYLASE in the last 168 hours. No results for input(s): AMMONIA in the last 168 hours.  CBC: Recent Labs  Lab 07/29/18 0711 08/01/18 1109 08/02/18 0452  WBC 10.4 12.1* 9.9  NEUTROABS 6.6  --   --   HGB 7.6* 7.0* 7.0*  HCT 25.7* 23.3* 23.6*  MCV 88.6 88.9 90.1  PLT 736* 491* 557*     Cardiac Enzymes: No results for input(s): CKTOTAL, CKMB, CKMBINDEX, TROPONINI in the last 168 hours.  BNP (last 3 results) No results for input(s): BNP in the last 8760 hours.  ProBNP (last 3 results) No results for input(s): PROBNP in the last 8760 hours.  Radiological Exams: Dg Chest Port 1 View  Result Date: 08/01/2018 CLINICAL DATA:  Pleural effusion EXAM: PORTABLE CHEST 1 VIEW COMPARISON:  07/25/2018 FINDINGS: Improvement in diffuse bilateral airspace disease compatible with improving edema. Minimal pleural effusion bilaterally with mild bibasilar atelectasis. Improved aeration in the bases. Postop CABG. Right coronary stent. Tracheostomy in good position. Underlying COPD IMPRESSION: Partial clearing of pulmonary edema. Improved aeration in the lung bases. Electronically Signed   By: Franchot Gallo M.D.   On: 08/01/2018 16:52    Assessment/Plan Active Problems:   Acute on chronic respiratory failure with hypoxia (HCC)   Chronic atrial fibrillation (HCC)   Acute systolic heart failure (HCC)   Tracheostomy care ([place])   Healthcare-associated pneumonia   HIT (heparin-induced thrombocytopenia) ([place])   1. Acute on chronic respiratory failure with hypoxia patient is currently on T collar wean as mentioned above the goal is 16 hours. 2. Chronic atrial fibrillation rate is controlled 3. Acute systolic heart failure compensated we will continue with supportive care 4. HIT platelets have been  stable 5. Healthcare associated pneumonia treated continue to follow   I have personally seen and evaluated the patient, evaluated laboratory and imaging results, formulated the assessment and plan and placed orders. The Patient requires high complexity decision making for assessment and support.  Case was discussed on Rounds with the Respiratory Therapy Staff  Allyne Gee, MD [facility] Pulmonary Critical Care Medicine Sleep Medicine

## 2018-08-02 NOTE — Consult Note (Signed)
Referring Physician: Dr. Merton Border, MD  Shannon Roman is an 62 y.o. female.                       Chief Complaint: Chronic systolic heart failure  HPI: 62 year old female with PMH of acute on chronic respiratory failure with hypoxemia, CAD and CABG, Acute on chronic left systolic heart failure, C. Difficile colitis, Paroxysmal atrial fibrillation, SVT, Tobacco use disorder, Severe protein calorie malnutrition, Right AKA, Chronic iron deficiency anemia, CKD, III and s/p Tracheostomy has chest x-ray showing pulmonary edema with partial resolution. Patient denies chest pain. She has chronic shortness of breath and has T Collar with 28 % oxygen supplementation and 100 % saturation.  Past Medical History:  Diagnosis Date  . Acute on chronic respiratory failure with hypoxia ([place])   . Acute systolic heart failure ([place])   . Anxiety   . C. difficile colitis   . Chronic atrial fibrillation ([place])   . Depression   . H/O oral cancer   . Healthcare-associated pneumonia 07/24/2018  . HIT (heparin-induced thrombocytopenia) ([place]) 07/24/2018  . Ischemic cardiomyopathy   . Tracheostomy care [facility])       Past Surgical History:  Procedure Laterality Date  . ABOVE KNEE LEG AMPUTATION    . CORONARY ARTERY BYPASS GRAFT    . MANDIBULECTOMY    . PEG PLACEMENT    . TRACHEOSTOMY      Family History  Family history unknown: Yes   Social History:  reports that she has been smoking. She has never used smokeless tobacco. She reports that she drank alcohol. She reports that she has current or past drug history.  Allergies: Allergies not on file  No medications prior to admission.    Results for orders placed or performed during the hospital encounter of 07/20/18 (from the past 48 hour(s))  CBC     Status: Abnormal   Collection Time: 08/01/18 11:09 AM  Result Value Ref Range   WBC 12.1 (H) 4.0 - 10.5 K/uL   RBC 2.62 (L) 3.87 - 5.11 MIL/uL   Hemoglobin 7.0 (L) 12.0 - 15.0 g/dL   HCT 23.3 (L) 36.0 -  46.0 %   MCV 88.9 78.0 - 100.0 fL   MCH 26.7 26.0 - 34.0 pg   MCHC 30.0 30.0 - 36.0 g/dL   RDW 17.2 (H) 11.5 - 15.5 %   Platelets 491 (H) 150 - 400 K/uL    Comment: Performed at [facility], [place] [address] [place], [place] [address]  Magnesium     Status: None   Collection Time: 08/01/18 11:09 AM  Result Value Ref Range   Magnesium 2.3 1.7 - 2.4 mg/dL    Comment: Performed at [facility] [address], [place], [place] [address]  Renal function panel     Status: Abnormal   Collection Time: 08/01/18 11:09 AM  Result Value Ref Range   Sodium 142 135 - 145 mmol/L   Potassium 3.7 3.5 - 5.1 mmol/L   Chloride 103 98 - 111 mmol/L   CO2 31 22 - 32 mmol/L   Glucose, Bld 103 (H) 70 - 99 mg/dL   BUN 48 (H) 6 - 20 mg/dL   Creatinine, Ser 1.08 (H) 0.44 - 1.00 mg/dL   Calcium 9.0 8.9 - 10.3 mg/dL   Phosphorus 3.9 2.5 - 4.6 mg/dL   Albumin 2.0 (L) 3.5 - 5.0 g/dL   GFR calc non Af Amer 55 (L) >60  mL/min   GFR calc Af Amer >60 >60 mL/min    Comment: (NOTE) The eGFR has been calculated using the CKD EPI equation. This calculation has not been validated in all clinical situations. eGFR's persistently <60 mL/min signify possible Chronic Kidney Disease.    Anion gap 8 5 - 15    Comment: Performed at [facility] [address] [place], [place] [address]  C difficile quick scan w PCR reflex     Status: None   Collection Time: 08/01/18 12:46 PM  Result Value Ref Range   C Diff antigen NEGATIVE NEGATIVE   C Diff toxin NEGATIVE NEGATIVE   C Diff interpretation No C. difficile detected.     Comment: Performed at [facility], [place] [address], [place], [place] [address]  Occult blood card to lab, stool     Status: Abnormal   Collection Time: 08/01/18 12:46 PM  Result Value Ref Range   Fecal Occult Bld POSITIVE (A) NEGATIVE    Comment: Performed at [facility], [place] [address], [place], [place] [address]  CBC     Status: Abnormal   Collection Time: 08/02/18   4:52 AM  Result Value Ref Range   WBC 9.9 4.0 - 10.5 K/uL   RBC 2.62 (L) 3.87 - 5.11 MIL/uL   Hemoglobin 7.0 (L) 12.0 - 15.0 g/dL   HCT 23.6 (L) 36.0 - 46.0 %   MCV 90.1 78.0 - 100.0 fL   MCH 26.7 26.0 - 34.0 pg   MCHC 29.7 (L) 30.0 - 36.0 g/dL   RDW 17.7 (H) 11.5 - 15.5 %   Platelets 557 (H) 150 - 400 K/uL    Comment: Performed at [facility], [place] [address] [place], [place] [address]  Renal function panel     Status: Abnormal   Collection Time: 08/02/18  4:52 AM  Result Value Ref Range   Sodium 141 135 - 145 mmol/L   Potassium 3.9 3.5 - 5.1 mmol/L   Chloride 104 98 - 111 mmol/L   CO2 27 22 - 32 mmol/L   Glucose, Bld 127 (H) 70 - 99 mg/dL   BUN 46 (H) 6 - 20 mg/dL   Creatinine, Ser 1.14 (H) 0.44 - 1.00 mg/dL   Calcium 8.9 8.9 - 10.3 mg/dL   Phosphorus 4.1 2.5 - 4.6 mg/dL   Albumin 2.0 (L) 3.5 - 5.0 g/dL   GFR calc non Af Amer 51 (L) >60 mL/min   GFR calc Af Amer 59 (L) >60 mL/min    Comment: (NOTE) The eGFR has been calculated using the CKD EPI equation. This calculation has not been validated in all clinical situations. eGFR's persistently <60 mL/min signify possible Chronic Kidney Disease.    Anion gap 10 5 - 15    Comment: Performed at [facility] [address] [place], [place] [address]  Protime-INR     Status: None   Collection Time: 08/02/18  4:52 AM  Result Value Ref Range   Prothrombin Time 14.9 11.4 - 15.2 seconds   INR 1.18     Comment: Performed at [facility] [address] [place], [place] [address]  Magnesium     Status: None   Collection Time: 08/02/18  4:52 AM  Result Value Ref Range   Magnesium 2.3 1.7 - 2.4 mg/dL    Comment: Performed at [facility] [address], [place], [place] [address]  Type and screen [facility]  Status: None (Preliminary result)   Collection Time: 08/02/18 12:00 PM  Result Value Ref Range   ABO/RH(D) A NEG    Antibody Screen NEG    Sample Expiration 08/05/2018    Unit  Number [id number]    Blood Component Type RED CELLS,LR    Unit division 00    Status of Unit ISSUED    Transfusion Status OK TO TRANSFUSE    Crossmatch Result      Compatible Performed at [facility], [place] [address], [place], [place] [address]   ABO/Rh     Status: None   Collection Time: 08/02/18 12:00 PM  Result Value Ref Range   ABO/RH(D)      A NEG Performed at [facility] [address] [place], [place] [address]   Prepare RBC     Status: None   Collection Time: 08/02/18 12:03 PM  Result Value Ref Range   Order Confirmation      ORDER PROCESSED BY BLOOD BANK Performed at [facility], [place] [address], [place], [place] [address]    Dg Chest Port 1 View  Result Date: 08/01/2018 CLINICAL DATA:  Pleural effusion EXAM: PORTABLE CHEST 1 VIEW COMPARISON:  07/25/2018 FINDINGS: Improvement in diffuse bilateral airspace disease compatible with improving edema. Minimal pleural effusion bilaterally with mild bibasilar atelectasis. Improved aeration in the bases. Postop CABG. Right coronary stent. Tracheostomy in good position. Underlying COPD IMPRESSION: Partial clearing of pulmonary edema. Improved aeration in the lung bases. Electronically Signed   By: Franchot Gallo M.D.   On: 08/01/2018 16:52    Review Of Systems Constitutional: H/O fever, chills, weight loss.. Eyes: No vision change, wears glasses. No discharge or pain. Ears: No hearing loss, No tinnitus. Respiratory: No asthma, positive COPD, pneumonias, shortness of breath. No hemoptysis. Cardiovascular: H/O chest pain, palpitation, leg edema. Gastrointestinal: Positive nausea, vomiting, diarrhea, no constipation. No GI bleed. No hepatitis. Genitourinary: No dysuria, hematuria, kidney stone. No incontinance. Neurological: No headache, stroke, seizures.  Psychiatry: No psych facility admission for anxiety, depression, suicide. No detox. Skin: No rash. Musculoskeletal: Positive joint pain, fibromyalgia,  neck pain, back pain. Lymphadenopathy: No lymphadenopathy. Hematology: Positive anemia, No easy bruising.   There were no vitals taken for this visit. There is no height or weight on file to calculate BMI.  P-77, R-18, BP-130/69, T-97.3, Pulse Ox-98 %. General appearance: alert, cooperative, appears older than stated age and in mild respiratory distress Head: Normocephalic, atraumatic. Eyes: pale conjunctiva, corneas clear. PERRL, EOM's intact. Neck: No adenopathy, no carotid bruit, no JVD, supple, symmetrical, trachea midline and thyroid not enlarged. Resp: Clearing to auscultation bilaterally. Cardio: Regular rate and rhythm, S1, S2 normal, II/VI systolic murmur, no click, rub or gallop GI: Soft, non-tender; bowel sounds normal; no organomegaly. Positive PEG placement. Extremities: No edema, cyanosis or clubbing. Right AKA. Skin: Warm and dry.  Neurologic: Alert and oriented X 3, Moves all extremities.  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 Difficile Colitis S/P Peg placement   Repeat limited echocardiogram for LV function. Continue Eliquis, Lasix, Metoprolol, potassium, Brilinta and Atorvastatin. IV albumin may assist in additional diuresis. Patient with multiple medical condition is not a candidate for cardiac interventions or ICD.  Birdie Riddle, MD  08/02/2018, 9:54 PM

## 2018-08-03 ENCOUNTER — Ambulatory Visit (HOSPITAL_COMMUNITY): Payer: No Typology Code available for payment source | Attending: Cardiovascular Disease

## 2018-08-03 DIAGNOSIS — J811 Chronic pulmonary edema: Secondary | ICD-10-CM | POA: Insufficient documentation

## 2018-08-03 DIAGNOSIS — J189 Pneumonia, unspecified organism: Secondary | ICD-10-CM | POA: Diagnosis not present

## 2018-08-03 DIAGNOSIS — I081 Rheumatic disorders of both mitral and tricuspid valves: Secondary | ICD-10-CM | POA: Diagnosis not present

## 2018-08-03 DIAGNOSIS — I2510 Atherosclerotic heart disease of native coronary artery without angina pectoris: Secondary | ICD-10-CM | POA: Diagnosis not present

## 2018-08-03 DIAGNOSIS — I482 Chronic atrial fibrillation: Secondary | ICD-10-CM | POA: Diagnosis not present

## 2018-08-03 DIAGNOSIS — I509 Heart failure, unspecified: Secondary | ICD-10-CM | POA: Insufficient documentation

## 2018-08-03 DIAGNOSIS — Z720 Tobacco use: Secondary | ICD-10-CM | POA: Diagnosis not present

## 2018-08-03 DIAGNOSIS — I255 Ischemic cardiomyopathy: Secondary | ICD-10-CM | POA: Diagnosis not present

## 2018-08-03 DIAGNOSIS — J9621 Acute and chronic respiratory failure with hypoxia: Secondary | ICD-10-CM | POA: Diagnosis not present

## 2018-08-03 DIAGNOSIS — I5021 Acute systolic (congestive) heart failure: Secondary | ICD-10-CM | POA: Diagnosis not present

## 2018-08-03 LAB — MAGNESIUM: MAGNESIUM: 2.1 mg/dL (ref 1.7–2.4)

## 2018-08-03 LAB — BPAM RBC
Blood Product Expiration Date: 201909122359
ISSUE DATE / TIME: 201909051448
UNIT TYPE AND RH: 9500

## 2018-08-03 LAB — TYPE AND SCREEN
ABO/RH(D): A NEG
Antibody Screen: NEGATIVE
Unit division: 0

## 2018-08-03 LAB — RENAL FUNCTION PANEL
ALBUMIN: 2.4 g/dL — AB (ref 3.5–5.0)
ANION GAP: 9 (ref 5–15)
BUN: 38 mg/dL — ABNORMAL HIGH (ref 6–20)
CO2: 27 mmol/L (ref 22–32)
Calcium: 9.3 mg/dL (ref 8.9–10.3)
Chloride: 107 mmol/L (ref 98–111)
Creatinine, Ser: 1.1 mg/dL — ABNORMAL HIGH (ref 0.44–1.00)
GFR calc Af Amer: >60 mL/min (ref >60)
GFR, EST NON AFRICAN AMERICAN: 53 mL/min — AB (ref >60)
Glucose, Bld: 143 mg/dL — ABNORMAL HIGH (ref 70–99)
PHOSPHORUS: 3 mg/dL (ref 2.5–4.6)
Potassium: 3.6 mmol/L (ref 3.5–5.1)
Sodium: 143 mmol/L (ref 135–145)

## 2018-08-03 LAB — CBC
HCT: 28.1 % — ABNORMAL LOW (ref 36.0–46.0)
HEMOGLOBIN: 8.8 g/dL — AB (ref 12.0–15.0)
MCH: 27.8 pg (ref 26.0–34.0)
MCHC: 31.3 g/dL (ref 30.0–36.0)
MCV: 88.9 fL (ref 78.0–100.0)
Platelets: 502 10*3/uL — ABNORMAL HIGH (ref 150–400)
RBC: 3.16 MIL/uL — ABNORMAL LOW (ref 3.87–5.11)
RDW: 16.7 % — ABNORMAL HIGH (ref 11.5–15.5)
WBC: 12 10*3/uL — AB (ref 4.0–10.5)

## 2018-08-03 NOTE — Progress Notes (Signed)
Pulmonary Critical Care Medicine [place]   PULMONARY CRITICAL CARE SERVICE  PROGRESS NOTE  Date of Service: 08/03/2018  Shannon Roman  WJX:[id number]  DOB: 1956-11-09   DOA: 07/20/2018  Referring Physician: Merton Border, MD  HPI: Shannon Roman is a 62 y.o. female seen for follow up of Acute on Chronic Respiratory Failure.  Patient is on T collar doing fairly well getting an echocardiogram  Medications: Reviewed on Rounds  Physical Exam:  Vitals: Temperature 97.1 pulse 90 respiratory 31 blood pressure 116/67 saturations 100%  Ventilator Settings off the ventilator on T collar  . General: Comfortable at this time . Eyes: Grossly normal lids, irises & conjunctiva . ENT: grossly tongue is normal . Neck: no obvious mass . Cardiovascular: S1 S2 normal no gallop . Respiratory: No rhonchi no rales . Abdomen: soft . Skin: no rash seen on limited exam . Musculoskeletal: not rigid . Psychiatric:unable to assess . Neurologic: no seizure no involuntary movements         Lab Data:   Basic Metabolic Panel: Recent Labs  Lab 07/29/18 0711 08/01/18 1109 08/02/18 0452 08/03/18 0935  NA 139 142 141 143  K 4.0 3.7 3.9 3.6  CL 99 103 104 107  CO2 30 31 27 27   GLUCOSE 106* 103* 127* 143*  BUN 42* 48* 46* 38*  CREATININE 0.94 1.08* 1.14* 1.10*  CALCIUM 8.7* 9.0 8.9 9.3  MG  --  2.3 2.3 2.1  PHOS  --  3.9 4.1 3.0    ABG: No results for input(s): PHART, PCO2ART, PO2ART, HCO3, O2SAT in the last 168 hours.  Liver Function Tests: Recent Labs  Lab 08/01/18 1109 08/02/18 0452 08/03/18 0935  ALBUMIN 2.0* 2.0* 2.4*   No results for input(s): LIPASE, AMYLASE in the last 168 hours. No results for input(s): AMMONIA in the last 168 hours.  CBC: Recent Labs  Lab 07/29/18 0711 08/01/18 1109 08/02/18 0452 08/03/18 0935  WBC 10.4 12.1* 9.9 12.0*  NEUTROABS 6.6  --   --   --   HGB 7.6* 7.0* 7.0* 8.8*  HCT 25.7* 23.3* 23.6* 28.1*  MCV 88.6 88.9 90.1  88.9  PLT 736* 491* 557* 502*    Cardiac Enzymes: No results for input(s): CKTOTAL, CKMB, CKMBINDEX, TROPONINI in the last 168 hours.  BNP (last 3 results) No results for input(s): BNP in the last 8760 hours.  ProBNP (last 3 results) No results for input(s): PROBNP in the last 8760 hours.  Radiological Exams: No results found.  Assessment/Plan Active Problems:   Acute on chronic respiratory failure with hypoxia (HCC)   Chronic atrial fibrillation (HCC)   Acute systolic heart failure (HCC)   Tracheostomy care ([place])   Healthcare-associated pneumonia   HIT (heparin-induced thrombocytopenia) ([place])   1. Acute on chronic respiratory failure with hypoxia we will continue weaning on T collar continue pulmonary toilet secretion management for 2. Chronic atrial fibrillation rate is controlled we will follow 3. Tracheostomy stable continue present management. 4. Healthcare associated pneumonia treated with antibiotics 5. HIT follow labs   I have personally seen and evaluated the patient, evaluated laboratory and imaging results, formulated the assessment and plan and placed orders. The Patient requires high complexity decision making for assessment and support.  Case was discussed on Rounds with the Respiratory Therapy Staff  Allyne Gee, MD [facility] Pulmonary Critical Care Medicine Sleep Medicine

## 2018-08-03 NOTE — Progress Notes (Signed)
  Echocardiogram 2D Echocardiogram has been performed.  Shannon Roman 08/03/2018, 1:09 PM

## 2018-08-04 DIAGNOSIS — J9621 Acute and chronic respiratory failure with hypoxia: Secondary | ICD-10-CM | POA: Diagnosis not present

## 2018-08-04 DIAGNOSIS — I5021 Acute systolic (congestive) heart failure: Secondary | ICD-10-CM | POA: Diagnosis not present

## 2018-08-04 DIAGNOSIS — J189 Pneumonia, unspecified organism: Secondary | ICD-10-CM | POA: Diagnosis not present

## 2018-08-04 DIAGNOSIS — I482 Chronic atrial fibrillation: Secondary | ICD-10-CM | POA: Diagnosis not present

## 2018-08-04 NOTE — Progress Notes (Signed)
Pulmonary Critical Care Medicine [place]   PULMONARY CRITICAL CARE SERVICE  PROGRESS NOTE  Date of Service: 08/04/2018  Shannon Roman  [id number]:[id number]  DOB: 08-25-1956   DOA: 07/20/2018  Referring Physician: Merton Border, MD  HPI: Shannon Roman is a 62 y.o. female seen for follow up of Acute on Chronic Respiratory Failure.  Patient is comfortable without distress remains afebrile.  Has been off the ventilator for more than 48 hours  Medications: Reviewed on Rounds  Physical Exam:  Vitals: Temperature 97.2 pulse 72 respiratory 24 blood pressure 111/59 saturation 99%  Ventilator Settings off the ventilator on T collar  . General: Comfortable at this time . Eyes: Grossly normal lids, irises & conjunctiva . ENT: grossly tongue is normal . Neck: no obvious mass . Cardiovascular: S1 S2 normal no gallop . Respiratory: No rhonchi no rales . Abdomen: soft . Skin: no rash seen on limited exam . Musculoskeletal: not rigid . Psychiatric:unable to assess . Neurologic: no seizure no involuntary movements         Lab Data:   Basic Metabolic Panel: Recent Labs  Lab 07/29/18 0711 08/01/18 1109 08/02/18 0452 08/03/18 0935  NA 139 142 141 143  K 4.0 3.7 3.9 3.6  CL 99 103 104 107  CO2 30 31 27 27   GLUCOSE 106* 103* 127* 143*  BUN 42* 48* 46* 38*  CREATININE 0.94 1.08* 1.14* 1.10*  CALCIUM 8.7* 9.0 8.9 9.3  MG  --  2.3 2.3 2.1  PHOS  --  3.9 4.1 3.0    ABG: No results for input(s): PHART, PCO2ART, PO2ART, HCO3, O2SAT in the last 168 hours.  Liver Function Tests: Recent Labs  Lab 08/01/18 1109 08/02/18 0452 08/03/18 0935  ALBUMIN 2.0* 2.0* 2.4*   No results for input(s): LIPASE, AMYLASE in the last 168 hours. No results for input(s): AMMONIA in the last 168 hours.  CBC: Recent Labs  Lab 07/29/18 0711 08/01/18 1109 08/02/18 0452 08/03/18 0935  WBC 10.4 12.1* 9.9 12.0*  NEUTROABS 6.6  --   --   --   HGB 7.6* 7.0* 7.0* 8.8*  HCT 25.7*  23.3* 23.6* 28.1*  MCV 88.6 88.9 90.1 88.9  PLT 736* 491* 557* 502*    Cardiac Enzymes: No results for input(s): CKTOTAL, CKMB, CKMBINDEX, TROPONINI in the last 168 hours.  BNP (last 3 results) No results for input(s): BNP in the last 8760 hours.  ProBNP (last 3 results) No results for input(s): PROBNP in the last 8760 hours.  Radiological Exams: No results found.  Assessment/Plan Active Problems:   Acute on chronic respiratory failure with hypoxia (HCC)   Chronic atrial fibrillation (HCC)   Acute systolic heart failure (HCC)   Tracheostomy care ([place])   Healthcare-associated pneumonia   HIT (heparin-induced thrombocytopenia) ([place])   1. Acute on chronic respiratory failure with hypoxia we will continue with weaning on T collar trials hopefully begin PMV and capping soon continue with supportive care and follow 2. Chronic atrial fibrillation rate is controlled continue present management. 3. Acute systolic heart failure compensated 4. Healthcare associated pneumonia treated we will follow 5. HIT monitor platelets they have been stable   I have personally seen and evaluated the patient, evaluated laboratory and imaging results, formulated the assessment and plan and placed orders. The Patient requires high complexity decision making for assessment and support.  Case was discussed on Rounds with the Respiratory Therapy Staff  Allyne Gee, MD [facility] Pulmonary Critical Care Medicine Sleep Medicine

## 2018-08-05 DIAGNOSIS — I5021 Acute systolic (congestive) heart failure: Secondary | ICD-10-CM | POA: Diagnosis not present

## 2018-08-05 DIAGNOSIS — J189 Pneumonia, unspecified organism: Secondary | ICD-10-CM | POA: Diagnosis not present

## 2018-08-05 DIAGNOSIS — J9621 Acute and chronic respiratory failure with hypoxia: Secondary | ICD-10-CM | POA: Diagnosis not present

## 2018-08-05 DIAGNOSIS — I482 Chronic atrial fibrillation: Secondary | ICD-10-CM | POA: Diagnosis not present

## 2018-08-05 LAB — RENAL FUNCTION PANEL
ALBUMIN: 2.3 g/dL — AB (ref 3.5–5.0)
Anion gap: 8 (ref 5–15)
BUN: 89 mg/dL — AB (ref 6–20)
CHLORIDE: 108 mmol/L (ref 98–111)
CO2: 31 mmol/L (ref 22–32)
Calcium: 9.1 mg/dL (ref 8.9–10.3)
Creatinine, Ser: 1.45 mg/dL — ABNORMAL HIGH (ref 0.44–1.00)
GFR calc Af Amer: 44 mL/min — ABNORMAL LOW (ref >60)
GFR calc non Af Amer: 38 mL/min — ABNORMAL LOW (ref >60)
Glucose, Bld: 129 mg/dL — ABNORMAL HIGH (ref 70–99)
PHOSPHORUS: 5.1 mg/dL — AB (ref 2.5–4.6)
POTASSIUM: 4.2 mmol/L (ref 3.5–5.1)
SODIUM: 147 mmol/L — AB (ref 135–145)

## 2018-08-05 LAB — CBC WITH DIFFERENTIAL/PLATELET
Abs Immature Granulocytes: 0.1 10*3/uL (ref 0.0–0.1)
BASOS ABS: 0.2 10*3/uL — AB (ref 0.0–0.1)
Basophils Relative: 2 %
EOS ABS: 0.8 10*3/uL — AB (ref 0.0–0.7)
EOS PCT: 6 %
HCT: 24.1 % — ABNORMAL LOW (ref 36.0–46.0)
Hemoglobin: 7.4 g/dL — ABNORMAL LOW (ref 12.0–15.0)
Immature Granulocytes: 1 %
Lymphocytes Relative: 18 %
Lymphs Abs: 2.6 10*3/uL (ref 0.7–4.0)
MCH: 29.1 pg (ref 26.0–34.0)
MCHC: 30.7 g/dL (ref 30.0–36.0)
MCV: 94.9 fL (ref 78.0–100.0)
Monocytes Absolute: 0.9 10*3/uL (ref 0.1–1.0)
Monocytes Relative: 6 %
Neutro Abs: 9.6 10*3/uL — ABNORMAL HIGH (ref 1.7–7.7)
Neutrophils Relative %: 67 %
PLATELETS: 479 10*3/uL — AB (ref 150–400)
RBC: 2.54 MIL/uL — ABNORMAL LOW (ref 3.87–5.11)
RDW: 18.5 % — AB (ref 11.5–15.5)
WBC: 14.1 10*3/uL — AB (ref 4.0–10.5)

## 2018-08-05 LAB — LACTIC ACID, PLASMA: LACTIC ACID, VENOUS: 1.4 mmol/L (ref 0.5–1.9)

## 2018-08-05 LAB — MAGNESIUM: MAGNESIUM: 2.6 mg/dL — AB (ref 1.7–2.4)

## 2018-08-05 NOTE — Progress Notes (Signed)
Pulmonary Critical Care Medicine [place]   PULMONARY CRITICAL CARE SERVICE  PROGRESS NOTE  Date of Service: 08/05/2018  Shannon Roman  TOI:[id number]  DOB: December 06, 1955   DOA: 07/20/2018  Referring Physician: Merton Border, MD  HPI: Shannon Roman is a 62 y.o. female seen for follow up of Acute on Chronic Respiratory Failure.  She is off the ventilator has been off for more than 48 hours  Medications: Reviewed on Rounds  Physical Exam:  Vitals: Temperature 98.2 pulse 67 respiratory 21 blood pressure 145/63 saturation 98%  Ventilator Settings currently on T collar trials 28% FiO2  . General: Comfortable at this time . Eyes: Grossly normal lids, irises & conjunctiva . ENT: grossly tongue is normal . Neck: no obvious mass . Cardiovascular: S1 S2 normal no gallop . Respiratory: Good air entry no rhonchi . Abdomen: soft . Skin: no rash seen on limited exam . Musculoskeletal: not rigid . Psychiatric:unable to assess . Neurologic: no seizure no involuntary movements         Lab Data:   Basic Metabolic Panel: Recent Labs  Lab 08/01/18 1109 08/02/18 0452 08/03/18 0935 08/05/18 0557  NA 142 141 143 147*  K 3.7 3.9 3.6 4.2  CL 103 104 107 108  CO2 31 27 27 31   GLUCOSE 103* 127* 143* 129*  BUN 48* 46* 38* 89*  CREATININE 1.08* 1.14* 1.10* 1.45*  CALCIUM 9.0 8.9 9.3 9.1  MG 2.3 2.3 2.1 2.6*  PHOS 3.9 4.1 3.0 5.1*    ABG: No results for input(s): PHART, PCO2ART, PO2ART, HCO3, O2SAT in the last 168 hours.  Liver Function Tests: Recent Labs  Lab 08/01/18 1109 08/02/18 0452 08/03/18 0935 08/05/18 0557  ALBUMIN 2.0* 2.0* 2.4* 2.3*   No results for input(s): LIPASE, AMYLASE in the last 168 hours. No results for input(s): AMMONIA in the last 168 hours.  CBC: Recent Labs  Lab 08/01/18 1109 08/02/18 0452 08/03/18 0935 08/05/18 0557  WBC 12.1* 9.9 12.0* 14.1*  NEUTROABS  --   --   --  9.6*  HGB 7.0* 7.0* 8.8* 7.4*  HCT 23.3* 23.6* 28.1*  24.1*  MCV 88.9 90.1 88.9 94.9  PLT 491* 557* 502* 479*    Cardiac Enzymes: No results for input(s): CKTOTAL, CKMB, CKMBINDEX, TROPONINI in the last 168 hours.  BNP (last 3 results) No results for input(s): BNP in the last 8760 hours.  ProBNP (last 3 results) No results for input(s): PROBNP in the last 8760 hours.  Radiological Exams: No results found.  Assessment/Plan Active Problems:   Acute on chronic respiratory failure with hypoxia (HCC)   Chronic atrial fibrillation (HCC)   Acute systolic heart failure (HCC)   Tracheostomy care ([place])   Healthcare-associated pneumonia   HIT (heparin-induced thrombocytopenia) ([place])   1. Acute on chronic respiratory failure with hypoxia continue to wean on the T collar as ordered.  Patient's mother was in the room she was updated 2. Chronic atrial fibrillation rate is controlled 3. Acute systolic heart failure compensated 4. Healthcare associated pneumonia treated 5. HIT platelets are stable   I have personally seen and evaluated the patient, evaluated laboratory and imaging results, formulated the assessment and plan and placed orders. The Patient requires high complexity decision making for assessment and support.  Case was discussed on Rounds with the Respiratory Therapy Staff  Allyne Gee, MD [facility] Pulmonary Critical Care Medicine Sleep Medicine

## 2018-08-06 DIAGNOSIS — J189 Pneumonia, unspecified organism: Secondary | ICD-10-CM | POA: Diagnosis not present

## 2018-08-06 DIAGNOSIS — J9621 Acute and chronic respiratory failure with hypoxia: Secondary | ICD-10-CM | POA: Diagnosis not present

## 2018-08-06 DIAGNOSIS — I482 Chronic atrial fibrillation: Secondary | ICD-10-CM | POA: Diagnosis not present

## 2018-08-06 DIAGNOSIS — I5021 Acute systolic (congestive) heart failure: Secondary | ICD-10-CM | POA: Diagnosis not present

## 2018-08-06 LAB — CBC
HEMATOCRIT: 21.9 % — AB (ref 36.0–46.0)
HEMOGLOBIN: 6.5 g/dL — AB (ref 12.0–15.0)
MCH: 28.1 pg (ref 26.0–34.0)
MCHC: 29.7 g/dL — AB (ref 30.0–36.0)
MCV: 94.8 fL (ref 78.0–100.0)
Platelets: 427 10*3/uL — ABNORMAL HIGH (ref 150–400)
RBC: 2.31 MIL/uL — ABNORMAL LOW (ref 3.87–5.11)
RDW: 19.2 % — ABNORMAL HIGH (ref 11.5–15.5)
WBC: 11.7 10*3/uL — ABNORMAL HIGH (ref 4.0–10.5)

## 2018-08-06 LAB — RENAL FUNCTION PANEL
ANION GAP: 11 (ref 5–15)
Albumin: 2.4 g/dL — ABNORMAL LOW (ref 3.5–5.0)
BUN: 84 mg/dL — ABNORMAL HIGH (ref 6–20)
CO2: 29 mmol/L (ref 22–32)
Calcium: 8.9 mg/dL (ref 8.9–10.3)
Chloride: 103 mmol/L (ref 98–111)
Creatinine, Ser: 1.36 mg/dL — ABNORMAL HIGH (ref 0.44–1.00)
GFR calc Af Amer: 48 mL/min — ABNORMAL LOW (ref >60)
GFR calc non Af Amer: 41 mL/min — ABNORMAL LOW (ref >60)
GLUCOSE: 119 mg/dL — AB (ref 70–99)
POTASSIUM: 4.3 mmol/L (ref 3.5–5.1)
Phosphorus: 4.3 mg/dL (ref 2.5–4.6)
Sodium: 143 mmol/L (ref 135–145)

## 2018-08-06 LAB — PREPARE RBC (CROSSMATCH)

## 2018-08-06 NOTE — Progress Notes (Signed)
Pulmonary Critical Care Medicine [place]   PULMONARY CRITICAL CARE SERVICE  PROGRESS NOTE  Date of Service: 08/06/2018  Shannon Roman  FTD:[id number]  DOB: 1956/05/01   DOA: 07/20/2018  Referring Physician: Merton Border, MD  HPI: Shannon Roman is a 62 y.o. female seen for follow up of Acute on Chronic Respiratory Failure.  Afebrile without distress at this time.  Patient is on T collar.  She is likely due for a trach tube change  Medications: Reviewed on Rounds  Physical Exam:  Vitals: Temperature 96.8 pulse 72 respiratory 15 blood pressure 112/60 saturations 100%  Ventilator Settings off the ventilator on T collar  . General: Comfortable at this time . Eyes: Grossly normal lids, irises & conjunctiva . ENT: grossly tongue is normal . Neck: no obvious mass . Cardiovascular: S1 S2 normal no gallop . Respiratory: No rhonchi or rales . Abdomen: soft . Skin: no rash seen on limited exam . Musculoskeletal: not rigid . Psychiatric:unable to assess . Neurologic: no seizure no involuntary movements         Lab Data:   Basic Metabolic Panel: Recent Labs  Lab 08/01/18 1109 08/02/18 0452 08/03/18 0935 08/05/18 0557 08/06/18 0650  NA 142 141 143 147* 143  K 3.7 3.9 3.6 4.2 4.3  CL 103 104 107 108 103  CO2 31 27 27 31 29   GLUCOSE 103* 127* 143* 129* 119*  BUN 48* 46* 38* 89* 84*  CREATININE 1.08* 1.14* 1.10* 1.45* 1.36*  CALCIUM 9.0 8.9 9.3 9.1 8.9  MG 2.3 2.3 2.1 2.6*  --   PHOS 3.9 4.1 3.0 5.1* 4.3    ABG: No results for input(s): PHART, PCO2ART, PO2ART, HCO3, O2SAT in the last 168 hours.  Liver Function Tests: Recent Labs  Lab 08/01/18 1109 08/02/18 0452 08/03/18 0935 08/05/18 0557 08/06/18 0650  ALBUMIN 2.0* 2.0* 2.4* 2.3* 2.4*   No results for input(s): LIPASE, AMYLASE in the last 168 hours. No results for input(s): AMMONIA in the last 168 hours.  CBC: Recent Labs  Lab 08/01/18 1109 08/02/18 0452 08/03/18 0935  08/05/18 0557 08/06/18 0650  WBC 12.1* 9.9 12.0* 14.1* 11.7*  NEUTROABS  --   --   --  9.6*  --   HGB 7.0* 7.0* 8.8* 7.4* 6.5*  HCT 23.3* 23.6* 28.1* 24.1* 21.9*  MCV 88.9 90.1 88.9 94.9 94.8  PLT 491* 557* 502* 479* 427*    Cardiac Enzymes: No results for input(s): CKTOTAL, CKMB, CKMBINDEX, TROPONINI in the last 168 hours.  BNP (last 3 results) No results for input(s): BNP in the last 8760 hours.  ProBNP (last 3 results) No results for input(s): PROBNP in the last 8760 hours.  Radiological Exams: No results found.  Assessment/Plan Active Problems:   Acute on chronic respiratory failure with hypoxia (HCC)   Chronic atrial fibrillation (HCC)   Acute systolic heart failure (HCC)   Tracheostomy care ([place])   Healthcare-associated pneumonia   HIT (heparin-induced thrombocytopenia) ([place])   1. Acute on chronic respiratory failure with hypoxia patient is going to be continued on T collar trials hopefully we will change the trach tube and then begin on capping soon.  Continue aggressive pulmonary toilet supportive care 2. Chronic atrial fibrillation rate is controlled we will follow 3. Acute systolic heart failure follow-up on x-rays monitor fluid status 4. Healthcare associated pneumonia treated 5. 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 The Patient requires high complexity decision making for assessment and support.  Case was discussed on Rounds with the Respiratory Therapy Staff  Allyne Gee, MD [facility] Pulmonary Critical Care Medicine Sleep Medicine

## 2018-08-07 DIAGNOSIS — I482 Chronic atrial fibrillation: Secondary | ICD-10-CM | POA: Diagnosis not present

## 2018-08-07 DIAGNOSIS — I5021 Acute systolic (congestive) heart failure: Secondary | ICD-10-CM | POA: Diagnosis not present

## 2018-08-07 DIAGNOSIS — J189 Pneumonia, unspecified organism: Secondary | ICD-10-CM | POA: Diagnosis not present

## 2018-08-07 DIAGNOSIS — J9621 Acute and chronic respiratory failure with hypoxia: Secondary | ICD-10-CM | POA: Diagnosis not present

## 2018-08-07 LAB — BPAM RBC
Blood Product Expiration Date: 201909142359
ISSUE DATE / TIME: 201909091238
Unit Type and Rh: 600

## 2018-08-07 LAB — CBC
HCT: 25.5 % — ABNORMAL LOW (ref 36.0–46.0)
HEMOGLOBIN: 7.9 g/dL — AB (ref 12.0–15.0)
MCH: 28.5 pg (ref 26.0–34.0)
MCHC: 31 g/dL (ref 30.0–36.0)
MCV: 92.1 fL (ref 78.0–100.0)
PLATELETS: 411 10*3/uL — AB (ref 150–400)
RBC: 2.77 MIL/uL — AB (ref 3.87–5.11)
RDW: 19.4 % — ABNORMAL HIGH (ref 11.5–15.5)
WBC: 12.8 10*3/uL — ABNORMAL HIGH (ref 4.0–10.5)

## 2018-08-07 LAB — TYPE AND SCREEN
ABO/RH(D): A NEG
Antibody Screen: NEGATIVE
UNIT DIVISION: 0

## 2018-08-07 LAB — BASIC METABOLIC PANEL
Anion gap: 11 (ref 5–15)
BUN: 71 mg/dL — ABNORMAL HIGH (ref 6–20)
CALCIUM: 9.1 mg/dL (ref 8.9–10.3)
CO2: 27 mmol/L (ref 22–32)
CREATININE: 1.14 mg/dL — AB (ref 0.44–1.00)
Chloride: 104 mmol/L (ref 98–111)
GFR, EST AFRICAN AMERICAN: 59 mL/min — AB (ref >60)
GFR, EST NON AFRICAN AMERICAN: 51 mL/min — AB (ref >60)
Glucose, Bld: 103 mg/dL — ABNORMAL HIGH (ref 70–99)
Potassium: 4.7 mmol/L (ref 3.5–5.1)
SODIUM: 142 mmol/L (ref 135–145)

## 2018-08-07 NOTE — Progress Notes (Signed)
Pulmonary Critical Care Medicine [place]   PULMONARY CRITICAL CARE SERVICE  PROGRESS NOTE  Date of Service: 08/07/2018  Shannon Roman  RWE:[id number]  DOB: August 14, 1956   DOA: 07/20/2018  Referring Physician: Merton Border, MD  HPI: Shannon Roman is a 62 y.o. female seen for follow up of Acute on Chronic Respiratory Failure.  Patient is currently on T collar comfortable without distress has been tolerating PMV  Medications: Reviewed on Rounds  Physical Exam:  Vitals: Temperature 97.2 pulse 71 respiratory 19 blood pressure 130/70 saturations 100%  Ventilator Settings off the ventilator on 28% FiO2  . General: Comfortable at this time . Eyes: Grossly normal lids, irises & conjunctiva . ENT: grossly tongue is normal . Neck: no obvious mass . Cardiovascular: S1 S2 normal no gallop . Respiratory: Few rhonchi no rales are noted . Abdomen: soft . Skin: no rash seen on limited exam . Musculoskeletal: not rigid . Psychiatric:unable to assess . Neurologic: no seizure no involuntary movements         Lab Data:   Basic Metabolic Panel: Recent Labs  Lab 08/01/18 1109 08/02/18 0452 08/03/18 0935 08/05/18 0557 08/06/18 0650 08/07/18 1148  NA 142 141 143 147* 143 142  K 3.7 3.9 3.6 4.2 4.3 4.7  CL 103 104 107 108 103 104  CO2 31 27 27 31 29 27   GLUCOSE 103* 127* 143* 129* 119* 103*  BUN 48* 46* 38* 89* 84* 71*  CREATININE 1.08* 1.14* 1.10* 1.45* 1.36* 1.14*  CALCIUM 9.0 8.9 9.3 9.1 8.9 9.1  MG 2.3 2.3 2.1 2.6*  --   --   PHOS 3.9 4.1 3.0 5.1* 4.3  --     ABG: No results for input(s): PHART, PCO2ART, PO2ART, HCO3, O2SAT in the last 168 hours.  Liver Function Tests: Recent Labs  Lab 08/01/18 1109 08/02/18 0452 08/03/18 0935 08/05/18 0557 08/06/18 0650  ALBUMIN 2.0* 2.0* 2.4* 2.3* 2.4*   No results for input(s): LIPASE, AMYLASE in the last 168 hours. No results for input(s): AMMONIA in the last 168 hours.  CBC: Recent Labs  Lab  08/02/18 0452 08/03/18 0935 08/05/18 0557 08/06/18 0650 08/07/18 1148  WBC 9.9 12.0* 14.1* 11.7* 12.8*  NEUTROABS  --   --  9.6*  --   --   HGB 7.0* 8.8* 7.4* 6.5* 7.9*  HCT 23.6* 28.1* 24.1* 21.9* 25.5*  MCV 90.1 88.9 94.9 94.8 92.1  PLT 557* 502* 479* 427* 411*    Cardiac Enzymes: No results for input(s): CKTOTAL, CKMB, CKMBINDEX, TROPONINI in the last 168 hours.  BNP (last 3 results) No results for input(s): BNP in the last 8760 hours.  ProBNP (last 3 results) No results for input(s): PROBNP in the last 8760 hours.  Radiological Exams: No results found.  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 Acute on chronic respiratory failure with hypoxia we will continue with the T collar wean the tracheostomy will be changed out by ENT because of her surgical history. 2. Chronic atrial fibrillation rate is controlled continue to follow 3. Acute systolic heart failure compensated continue to monitor 4. Healthcare associated pneumonia treated 5. HIT stable   I have personally seen and evaluated the patient, evaluated laboratory and imaging results, formulated the assessment and plan and placed orders. The  Patient requires high complexity decision making for assessment and support.  Case was discussed on Rounds with the Respiratory Therapy Staff  Allyne Gee, MD [facility] Pulmonary Critical Care Medicine Sleep Medicine

## 2018-08-08 ENCOUNTER — Encounter (HOSPITAL_COMMUNITY): Payer: Self-pay

## 2018-08-08 DIAGNOSIS — J9621 Acute and chronic respiratory failure with hypoxia: Secondary | ICD-10-CM | POA: Diagnosis not present

## 2018-08-08 DIAGNOSIS — J189 Pneumonia, unspecified organism: Secondary | ICD-10-CM | POA: Diagnosis not present

## 2018-08-08 DIAGNOSIS — I482 Chronic atrial fibrillation: Secondary | ICD-10-CM | POA: Diagnosis not present

## 2018-08-08 DIAGNOSIS — I5021 Acute systolic (congestive) heart failure: Secondary | ICD-10-CM | POA: Diagnosis not present

## 2018-08-08 NOTE — Progress Notes (Signed)
Pulmonary Critical Care Medicine [place]   PULMONARY CRITICAL CARE SERVICE  PROGRESS NOTE  Date of Service: 08/08/2018  ZIYANNA TOLIN  NLZ:[id number]  DOB: Aug 12, 1956   DOA: 07/20/2018  Referring Physician: Merton Border, MD  HPI: Shannon Roman is a 62 y.o. female seen for follow up of Acute on Chronic Respiratory Failure.  Patient is on T collar she is been tolerating it fairly well.  She has been on 28% oxygen  Medications: Reviewed on Rounds  Physical Exam:  Vitals: Temperature 97.1 pulse 75 respiratory rate 11 blood pressure 113/65 saturations are 100%  Ventilator Settings off the ventilator on T collar  . General: Comfortable at this time . Eyes: Grossly normal lids, irises & conjunctiva . ENT: grossly tongue is normal . Neck: no obvious mass . Cardiovascular: S1 S2 normal no gallop . Respiratory: No rhonchi no rales . Abdomen: soft . Skin: no rash seen on limited exam . Musculoskeletal: not rigid . Psychiatric:unable to assess . Neurologic: no seizure no involuntary movements         Lab Data:   Basic Metabolic Panel: Recent Labs  Lab 08/02/18 0452 08/03/18 0935 08/05/18 0557 08/06/18 0650 08/07/18 1148  NA 141 143 147* 143 142  K 3.9 3.6 4.2 4.3 4.7  CL 104 107 108 103 104  CO2 27 27 31 29 27   GLUCOSE 127* 143* 129* 119* 103*  BUN 46* 38* 89* 84* 71*  CREATININE 1.14* 1.10* 1.45* 1.36* 1.14*  CALCIUM 8.9 9.3 9.1 8.9 9.1  MG 2.3 2.1 2.6*  --   --   PHOS 4.1 3.0 5.1* 4.3  --     ABG: No results for input(s): PHART, PCO2ART, PO2ART, HCO3, O2SAT in the last 168 hours.  Liver Function Tests: Recent Labs  Lab 08/02/18 0452 08/03/18 0935 08/05/18 0557 08/06/18 0650  ALBUMIN 2.0* 2.4* 2.3* 2.4*   No results for input(s): LIPASE, AMYLASE in the last 168 hours. No results for input(s): AMMONIA in the last 168 hours.  CBC: Recent Labs  Lab 08/02/18 0452 08/03/18 0935 08/05/18 0557 08/06/18 0650 08/07/18 1148  WBC  9.9 12.0* 14.1* 11.7* 12.8*  NEUTROABS  --   --  9.6*  --   --   HGB 7.0* 8.8* 7.4* 6.5* 7.9*  HCT 23.6* 28.1* 24.1* 21.9* 25.5*  MCV 90.1 88.9 94.9 94.8 92.1  PLT 557* 502* 479* 427* 411*    Cardiac Enzymes: No results for input(s): CKTOTAL, CKMB, CKMBINDEX, TROPONINI in the last 168 hours.  BNP (last 3 results) No results for input(s): BNP in the last 8760 hours.  ProBNP (last 3 results) No results for input(s): PROBNP in the last 8760 hours.  Radiological Exams: No results found.  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 Acute on chronic respiratory failure with hypoxia continue weaning on T collar as tolerated.  Continue aggressive pulmonary toilet supportive care titrate oxygen as tolerated 2. Chronic atrial fibrillation rate is controlled we will continue to follow 3. Acute systolic heart failure at baseline compensated 4. Healthcare associated pneumonia treated we will continue to follow 5. HIT platelet counts are stable   I have personally seen and evaluated the patient, evaluated laboratory and imaging results, formulated the assessment and plan and placed orders. The Patient requires high  complexity decision making for assessment and support.  Case was discussed on Rounds with the Respiratory Therapy Staff  Allyne Gee, MD [facility] Pulmonary Critical Care Medicine Sleep Medicine

## 2018-08-09 ENCOUNTER — Other Ambulatory Visit (HOSPITAL_COMMUNITY): Payer: Self-pay

## 2018-08-09 DIAGNOSIS — I482 Chronic atrial fibrillation: Secondary | ICD-10-CM | POA: Diagnosis not present

## 2018-08-09 DIAGNOSIS — I5021 Acute systolic (congestive) heart failure: Secondary | ICD-10-CM | POA: Diagnosis not present

## 2018-08-09 DIAGNOSIS — J9621 Acute and chronic respiratory failure with hypoxia: Secondary | ICD-10-CM | POA: Diagnosis not present

## 2018-08-09 DIAGNOSIS — J189 Pneumonia, unspecified organism: Secondary | ICD-10-CM | POA: Diagnosis not present

## 2018-08-09 NOTE — Progress Notes (Signed)
Pulmonary Critical Care Medicine [place]   PULMONARY CRITICAL CARE SERVICE  PROGRESS NOTE  Date of Service: 08/09/2018  Shannon Roman  CVE:[id number]  DOB: [birth year]   DOA: 07/20/2018  Referring Physician: Merton Border, MD  HPI: Shannon Roman is a 62 y.o. female seen for follow up of Acute on Chronic Respiratory Failure.  She is on T collar has been on 28% oxygen and had a ENT change the tracheostomy to a #6 cuffless  Medications: Reviewed on Rounds  Physical Exam:  Vitals: Temperature 96.6 pulse 75 respiratory rate 16 blood pressure 113/57 saturations 100%  Ventilator Settings off the ventilator on T collar trials  . General: Comfortable at this time . Eyes: Grossly normal lids, irises & conjunctiva . ENT: grossly tongue is normal . Neck: no obvious mass . Cardiovascular: S1 S2 normal no gallop . Respiratory: No rhonchi no rales are noted . Abdomen: soft . Skin: no rash seen on limited exam . Musculoskeletal: not rigid . Psychiatric:unable to assess . Neurologic: no seizure no involuntary movements         Lab Data:   Basic Metabolic Panel: Recent Labs  Lab 08/03/18 0935 08/05/18 0557 08/06/18 0650 08/07/18 1148  NA 143 147* 143 142  K 3.6 4.2 4.3 4.7  CL 107 108 103 104  CO2 27 31 29 27   GLUCOSE 143* 129* 119* 103*  BUN 38* 89* 84* 71*  CREATININE 1.10* 1.45* 1.36* 1.14*  CALCIUM 9.3 9.1 8.9 9.1  MG 2.1 2.6*  --   --   PHOS 3.0 5.1* 4.3  --     ABG: No results for input(s): PHART, PCO2ART, PO2ART, HCO3, O2SAT in the last 168 hours.  Liver Function Tests: Recent Labs  Lab 08/03/18 0935 08/05/18 0557 08/06/18 0650  ALBUMIN 2.4* 2.3* 2.4*   No results for input(s): LIPASE, AMYLASE in the last 168 hours. No results for input(s): AMMONIA in the last 168 hours.  CBC: Recent Labs  Lab 08/03/18 0935 08/05/18 0557 08/06/18 0650 08/07/18 1148  WBC 12.0* 14.1* 11.7* 12.8*  NEUTROABS  --  9.6*  --   --   HGB 8.8* 7.4*  6.5* 7.9*  HCT 28.1* 24.1* 21.9* 25.5*  MCV 88.9 94.9 94.8 92.1  PLT 502* 479* 427* 411*    Cardiac Enzymes: No results for input(s): CKTOTAL, CKMB, CKMBINDEX, TROPONINI in the last 168 hours.  BNP (last 3 results) No results for input(s): BNP in the last 8760 hours.  ProBNP (last 3 results) No results for input(s): PROBNP in the last 8760 hours.  Radiological Exams: No results found.  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 Acute on chronic respiratory failure with hypoxia we will continue with weaning on T collar continue aggressive pulmonary toilet supportive care.  She is not to be fed 2. Chronic atrial fibrillation rate is controlled we will continue present management 3. Tracheostomy as mentioned was changed by ENT 4. Healthcare associated pneumonia treated we will continue with present management 5. 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 The Patient requires high complexity decision making for assessment and support.  Case was discussed on Rounds with the Respiratory Therapy Staff  Allyne Gee, MD [facility] Pulmonary Critical Care Medicine Sleep Medicine

## 2018-08-10 ENCOUNTER — Other Ambulatory Visit (HOSPITAL_COMMUNITY): Payer: Self-pay

## 2018-08-10 DIAGNOSIS — F330 Major depressive disorder, recurrent, mild: Secondary | ICD-10-CM

## 2018-08-10 DIAGNOSIS — F411 Generalized anxiety disorder: Secondary | ICD-10-CM

## 2018-08-10 LAB — CBC
HCT: 23.9 % — ABNORMAL LOW (ref 36.0–46.0)
HEMOGLOBIN: 7.4 g/dL — AB (ref 12.0–15.0)
MCH: 29.2 pg (ref 26.0–34.0)
MCHC: 31 g/dL (ref 30.0–36.0)
MCV: 94.5 fL (ref 78.0–100.0)
Platelets: 394 10*3/uL (ref 150–400)
RBC: 2.53 MIL/uL — ABNORMAL LOW (ref 3.87–5.11)
RDW: 18.9 % — ABNORMAL HIGH (ref 11.5–15.5)
WBC: 9.7 10*3/uL (ref 4.0–10.5)

## 2018-08-10 LAB — BASIC METABOLIC PANEL
ANION GAP: 8 (ref 5–15)
BUN: 41 mg/dL — ABNORMAL HIGH (ref 6–20)
CHLORIDE: 102 mmol/L (ref 98–111)
CO2: 29 mmol/L (ref 22–32)
Calcium: 8.8 mg/dL — ABNORMAL LOW (ref 8.9–10.3)
Creatinine, Ser: 1.03 mg/dL — ABNORMAL HIGH (ref 0.44–1.00)
GFR calc non Af Amer: 58 mL/min — ABNORMAL LOW (ref >60)
Glucose, Bld: 106 mg/dL — ABNORMAL HIGH (ref 70–99)
POTASSIUM: 4.4 mmol/L (ref 3.5–5.1)
Sodium: 139 mmol/L (ref 135–145)

## 2018-08-10 NOTE — Consult Note (Signed)
[facility] Psychiatry Consult   Reason for Consult: depression and anxiety Referring Physician:  Dr. Laren Everts Patient Identification: Shannon Roman MRN:  [id number] Principal Diagnosis: Generalized anxiety disorder Diagnosis:   Patient Active Problem List   Diagnosis Date Noted  . Generalized anxiety disorder [F41.1] 08/10/2018  . 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 Healthcare-associated pneumonia [J18.9] 07/24/2018  . HIT (heparin-induced thrombocytopenia) ([place]) [D75.82] 07/24/2018  . Acute on chronic respiratory failure with hypoxia (HCC) [J96.21]   . Chronic atrial fibrillation ([place]) [I48.2]   . Acute systolic heart failure ([place]) [I50.21]   . 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 Patient reports prior history of Anxiety and depression but unable to recall which medications helped her in the past. She reports a trial of of Lexapro which she did not like and refused to take after 1 to 2 doses. She reports excessive worries, apprehensions, irritability, anxiety, sadness and hopelessness about her multiple medical problem. But says she is happy with her current medications for mood and anxiety. She denies psychosis, delusions, suicidal ideations, intent or plan.  Past Psychiatric History: as above  Risk to Self:  denies Risk to Others:  denies Prior Inpatient Therapy:   Prior Outpatient Therapy:    Past Medical History:  Past Medical History:  Diagnosis Date  . Acute on chronic respiratory failure  with hypoxia ([place])   . Acute systolic heart failure ([place])   . Anxiety   . C. difficile colitis   . Chronic atrial fibrillation ([place])   . Depression   . H/O oral cancer   . Healthcare-associated pneumonia 07/24/2018  . HIT (heparin-induced thrombocytopenia) ([place]) 07/24/2018  . Ischemic cardiomyopathy   . Tracheostomy care [facility])     Past Surgical History:  Procedure Laterality Date  . ABOVE KNEE LEG AMPUTATION    . CORONARY ARTERY BYPASS GRAFT    . IR [place] TUBE PERCUT W/FLUORO  08/01/2018  . MANDIBULECTOMY    . PEG PLACEMENT    . TRACHEOSTOMY     Family History:  Family History  Family history unknown: Yes   Family Psychiatric  History:  Social History:  Social History   Substance and Sexual Activity  Alcohol Use Not Currently     Social History   Substance and Sexual Activity  Drug Use Not Currently    Social History   Socioeconomic History  . Marital status: Divorced    Spouse name: Not on file  . Number of children: Not on file  . Years of education: Not on file  . Highest education level: Not on file  Occupational History  . Not on file  Social Needs  . Financial resource strain: Not on file  . Food insecurity:    Worry: Not on file    Inability: Not on file  . Transportation needs:    Medical: Not on file    Non-medical: Not on file  Tobacco Use  . Smoking status: Smoker, Current Status Unknown  . Smokeless tobacco: Never Used  Substance and Sexual Activity  . Alcohol use: Not Currently  . Drug  use: Not Currently  . Sexual activity: Not Currently  Lifestyle  . Physical activity:    Days per week: Not on file    Minutes per session: Not on file  . Stress: Not on file  Relationships  . Social connections:    Talks on phone: Not on file    Gets together: Not on file    Attends religious service: Not on file    Active member of club or organization: Not on file    Attends meetings of clubs or organizations: Not on file     Relationship status: Not on file  Other Topics Concern  . Not on file  Social History Narrative  . Not on file   Additional Social History:    Allergies:  Allergies not on file  Labs:  Results for orders placed or performed during the hospital encounter of 07/20/18 (from the past 48 hour(s))  CBC     Status: Abnormal   Collection Time: 08/10/18  5:45 AM  Result Value Ref Range   WBC 9.7 4.0 - 10.5 K/uL   RBC 2.53 (L) 3.87 - 5.11 MIL/uL   Hemoglobin 7.4 (L) 12.0 - 15.0 g/dL   HCT 23.9 (L) 36.0 - 46.0 %   MCV 94.5 78.0 - 100.0 fL   MCH 29.2 26.0 - 34.0 pg   MCHC 31.0 30.0 - 36.0 g/dL   RDW 18.9 (H) 11.5 - 15.5 %   Platelets 394 150 - 400 K/uL    Comment: Performed at [facility], [place] [address] [place], [place] [address]  Basic metabolic panel     Status: Abnormal   Collection Time: 08/10/18  5:45 AM  Result Value Ref Range   Sodium 139 135 - 145 mmol/L   Potassium 4.4 3.5 - 5.1 mmol/L   Chloride 102 98 - 111 mmol/L   CO2 29 22 - 32 mmol/L   Glucose, Bld 106 (H) 70 - 99 mg/dL   BUN 41 (H) 6 - 20 mg/dL   Creatinine, Ser 1.03 (H) 0.44 - 1.00 mg/dL   Calcium 8.8 (L) 8.9 - 10.3 mg/dL   GFR calc non Af Amer 58 (L) >60 mL/min   GFR calc Af Amer >60 >60 mL/min    Comment: (NOTE) The eGFR has been calculated using the CKD EPI equation. This calculation has not been validated in all clinical situations. eGFR's persistently <60 mL/min signify possible Chronic Kidney Disease.    Anion gap 8 5 - 15    Comment: Performed at [facility] [address] [place], [place] [address]    No current facility-administered medications for this encounter.     Musculoskeletal: Strength & Muscle Tone: within normal limits Gait & Station: not tested Patient leans: N/A  Psychiatric Specialty Exam: Physical Exam  Psychiatric: Judgment and thought content normal. Her mood appears anxious. Her speech is delayed. She is slowed. Cognition and memory are normal. She exhibits a  depressed mood.    Review of Systems  Psychiatric/Behavioral: Positive for depression. The patient is nervous/anxious.     There were no vitals taken for this visit.There is no height or weight on file to calculate BMI.  General Appearance: Casual  Eye Contact:  Fair  Speech:  Clear and Coherent  Volume:  Normal  Mood:  Anxious and Dysphoric  Affect:  Congruent  Thought Process:  Coherent  Orientation:  Full (Time, Place, and Person)  Thought Content:  Logical  Suicidal Thoughts:  No  Homicidal Thoughts:  No  Memory:  Immediate;   Fair Recent;   Fair Remote;   Fair  Judgement:  Fair  Insight:  Fair  Psychomotor Activity:  Psychomotor Retardation  Concentration:  Concentration: Fair and Attention Span: Fair  Recall:  [employer] of Knowledge:  Fair  Language:  Good  Akathisia:  No  Handed:  Right  AIMS (if indicated):     Assets:  Communication Skills  ADL's: marginal  Cognition:  WNL  Sleep:   fair     Treatment Plan Summary: Medication management  Continue Buspar 5 mg tid PO/PT for anxiety/depression Continue Clonazepam 0.5 mg bid as needed for anxiety attacks Continue Gabapentin 300 mg bid for mood/anxiety  Disposition: No evidence of imminent risk to self or others at present.   Supportive therapy provided about ongoing stressors.  Corena Pilgrim, MD 08/10/2018 5:25 PM

## 2018-08-11 LAB — CBC
HCT: 26.1 % — ABNORMAL LOW (ref 36.0–46.0)
HEMOGLOBIN: 7.9 g/dL — AB (ref 12.0–15.0)
MCH: 28.3 pg (ref 26.0–34.0)
MCHC: 30.3 g/dL (ref 30.0–36.0)
MCV: 93.5 fL (ref 78.0–100.0)
PLATELETS: 436 10*3/uL — AB (ref 150–400)
RBC: 2.79 MIL/uL — ABNORMAL LOW (ref 3.87–5.11)
RDW: 18.4 % — AB (ref 11.5–15.5)
WBC: 10.2 10*3/uL (ref 4.0–10.5)

## 2018-08-12 LAB — BASIC METABOLIC PANEL
ANION GAP: 13 (ref 5–15)
BUN: 31 mg/dL — ABNORMAL HIGH (ref 6–20)
CALCIUM: 9 mg/dL (ref 8.9–10.3)
CO2: 28 mmol/L (ref 22–32)
CREATININE: 1.04 mg/dL — AB (ref 0.44–1.00)
Chloride: 95 mmol/L — ABNORMAL LOW (ref 98–111)
GFR, EST NON AFRICAN AMERICAN: 57 mL/min — AB (ref >60)
Glucose, Bld: 102 mg/dL — ABNORMAL HIGH (ref 70–99)
Potassium: 4 mmol/L (ref 3.5–5.1)
Sodium: 136 mmol/L (ref 135–145)

## 2018-08-12 LAB — MAGNESIUM: Magnesium: 2 mg/dL (ref 1.7–2.4)

## 2018-08-13 DIAGNOSIS — J9621 Acute and chronic respiratory failure with hypoxia: Secondary | ICD-10-CM | POA: Diagnosis not present

## 2018-08-13 DIAGNOSIS — J189 Pneumonia, unspecified organism: Secondary | ICD-10-CM | POA: Diagnosis not present

## 2018-08-13 DIAGNOSIS — I482 Chronic atrial fibrillation: Secondary | ICD-10-CM | POA: Diagnosis not present

## 2018-08-13 DIAGNOSIS — I5021 Acute systolic (congestive) heart failure: Secondary | ICD-10-CM | POA: Diagnosis not present

## 2018-08-13 NOTE — Progress Notes (Signed)
Pulmonary Critical Care Medicine [place]   PULMONARY CRITICAL CARE SERVICE  PROGRESS NOTE  Date of Service: 08/13/2018  LOUISIANA SEARLES  ZLD:[id number]  DOB: [birth year]   DOA: 07/20/2018  Referring Physician: Merton Border, MD  HPI: Shannon Roman is a 62 y.o. female seen for follow up of Acute on Chronic Respiratory Failure.  She is comfortable sitting up in a chair.  She is on T collar tolerating PMV fairly well  Medications: Reviewed on Rounds  Physical Exam:  Vitals: Temperature 96.3 pulse 106 respiratory rate 16 blood pressure one 3/56 saturations 100%  Ventilator Settings currently off the ventilator on T collar trials  . General: Comfortable at this time . Eyes: Grossly normal lids, irises & conjunctiva . ENT: grossly tongue is normal . Neck: no obvious mass . Cardiovascular: S1 S2 normal no gallop . Respiratory: No rhonchi no rales are noted . Abdomen: soft . Skin: no rash seen on limited exam . Musculoskeletal: not rigid . Psychiatric:unable to assess . Neurologic: no seizure no involuntary movements         Lab Data:   Basic Metabolic Panel: Recent Labs  Lab 08/07/18 1148 08/10/18 0545 08/12/18 0720  NA 142 139 136  K 4.7 4.4 4.0  CL 104 102 95*  CO2 27 29 28   GLUCOSE 103* 106* 102*  BUN 71* 41* 31*  CREATININE 1.14* 1.03* 1.04*  CALCIUM 9.1 8.8* 9.0  MG  --   --  2.0    ABG: No results for input(s): PHART, PCO2ART, PO2ART, HCO3, O2SAT in the last 168 hours.  Liver Function Tests: No results for input(s): AST, ALT, ALKPHOS, BILITOT, PROT, ALBUMIN in the last 168 hours. No results for input(s): LIPASE, AMYLASE in the last 168 hours. No results for input(s): AMMONIA in the last 168 hours.  CBC: Recent Labs  Lab 08/07/18 1148 08/10/18 0545 08/11/18 0618  WBC 12.8* 9.7 10.2  HGB 7.9* 7.4* 7.9*  HCT 25.5* 23.9* 26.1*  MCV 92.1 94.5 93.5  PLT 411* 394 436*    Cardiac Enzymes: No results for input(s): CKTOTAL, CKMB,  CKMBINDEX, TROPONINI in the last 168 hours.  BNP (last 3 results) No results for input(s): BNP in the last 8760 hours.  ProBNP (last 3 results) No results for input(s): PROBNP in the last 8760 hours.  Radiological Exams: No results found.  Assessment/Plan Principal Problem:   Acute on chronic respiratory failure with hypoxia (HCC) Active Problems:   Chronic atrial fibrillation (HCC)   Acute systolic heart failure (HCC)   Tracheostomy care (HCC)   Healthcare-associated pneumonia   HIT (heparin-induced thrombocytopenia) (HCC)   Generalized anxiety disorder   Major depressive disorder, recurrent episode, mild ([place])   1. Acute on chronic respiratory failure with hypoxia we will continue with weaning on T collar she is not to be decannulated at this time.  She will be following up after discharge from the tracheostomy clinic. 2. Chronic tracheostomy as mentioned above will be following up with a tracheostomy clinic she had her trach changed and is tolerating the smaller trach well. 3. Chronic atrial fibrillation rate is controlled we will continue to follow 4. Acute systolic heart failure at baseline we will continue present management 5. Healthcare associated pneumonia treated we will continue to follow 6. HIT stable at this time we will continue present management   I have personally seen and evaluated the patient, evaluated laboratory and imaging results, formulated the assessment and plan and placed orders. The Patient requires high  complexity decision making for assessment and support.  Case was discussed on Rounds with the Respiratory Therapy Staff  Allyne Gee, MD [facility] Pulmonary Critical Care Medicine Sleep Medicine

## 2018-08-14 ENCOUNTER — Other Ambulatory Visit (HOSPITAL_COMMUNITY): Payer: Self-pay

## 2018-08-14 DIAGNOSIS — J189 Pneumonia, unspecified organism: Secondary | ICD-10-CM | POA: Diagnosis not present

## 2018-08-14 DIAGNOSIS — J9621 Acute and chronic respiratory failure with hypoxia: Secondary | ICD-10-CM | POA: Diagnosis not present

## 2018-08-14 DIAGNOSIS — I482 Chronic atrial fibrillation: Secondary | ICD-10-CM | POA: Diagnosis not present

## 2018-08-14 DIAGNOSIS — I5021 Acute systolic (congestive) heart failure: Secondary | ICD-10-CM | POA: Diagnosis not present

## 2018-08-14 LAB — RENAL FUNCTION PANEL
Albumin: 2.6 g/dL — ABNORMAL LOW (ref 3.5–5.0)
Anion gap: 15 (ref 5–15)
BUN: 30 mg/dL — AB (ref 6–20)
CHLORIDE: 92 mmol/L — AB (ref 98–111)
CO2: 28 mmol/L (ref 22–32)
Calcium: 9.3 mg/dL (ref 8.9–10.3)
Creatinine, Ser: 1.03 mg/dL — ABNORMAL HIGH (ref 0.44–1.00)
GFR calc Af Amer: >60 mL/min (ref >60)
GFR calc non Af Amer: 58 mL/min — ABNORMAL LOW (ref >60)
GLUCOSE: 110 mg/dL — AB (ref 70–99)
POTASSIUM: 3.7 mmol/L (ref 3.5–5.1)
Phosphorus: 5.1 mg/dL — ABNORMAL HIGH (ref 2.5–4.6)
Sodium: 135 mmol/L (ref 135–145)

## 2018-08-14 LAB — CBC
HCT: 26.6 % — ABNORMAL LOW (ref 36.0–46.0)
Hemoglobin: 8 g/dL — ABNORMAL LOW (ref 12.0–15.0)
MCH: 28.1 pg (ref 26.0–34.0)
MCHC: 30.1 g/dL (ref 30.0–36.0)
MCV: 93.3 fL (ref 78.0–100.0)
PLATELETS: 492 10*3/uL — AB (ref 150–400)
RBC: 2.85 MIL/uL — ABNORMAL LOW (ref 3.87–5.11)
RDW: 17.4 % — AB (ref 11.5–15.5)
WBC: 10.3 10*3/uL (ref 4.0–10.5)

## 2018-08-14 LAB — MAGNESIUM: Magnesium: 1.9 mg/dL (ref 1.7–2.4)

## 2018-08-14 NOTE — Progress Notes (Signed)
Pulmonary Critical Care Medicine [place]   PULMONARY CRITICAL CARE SERVICE  PROGRESS NOTE  Date of Service: 08/14/2018  Shannon Roman  JJK:[id number]  DOB: Apr 23, 1956   DOA: 07/20/2018  Referring Physician: Merton Border, MD  HPI: Shannon Roman is a 62 y.o. female seen for follow up of Acute on Chronic Respiratory Failure.  Patient reportedly had some bleeding from the tracheostomy now seems to be a little bit more comfortable  Medications: Reviewed on Rounds  Physical Exam:  Vitals: Temperature 97.9 pulse 69 respiratory 24 blood pressure 96/48 saturations 100%  Ventilator Settings off the ventilator on T collar currently on 28% FiO2 with the PMV  . General: Comfortable at this time . Eyes: Grossly normal lids, irises & conjunctiva . ENT: grossly tongue is normal . Neck: no obvious mass . Cardiovascular: S1 S2 normal no gallop . Respiratory: Coarse breath sounds no rhonchi are noted at this time. . Abdomen: soft . Skin: no rash seen on limited exam . Musculoskeletal: not rigid . Psychiatric:unable to assess . Neurologic: no seizure no involuntary movements         Lab Data:   Basic Metabolic Panel: Recent Labs  Lab 08/07/18 1148 08/10/18 0545 08/12/18 0720 08/14/18 0639  NA 142 139 136 135  K 4.7 4.4 4.0 3.7  CL 104 102 95* 92*  CO2 27 29 28 28   GLUCOSE 103* 106* 102* 110*  BUN 71* 41* 31* 30*  CREATININE 1.14* 1.03* 1.04* 1.03*  CALCIUM 9.1 8.8* 9.0 9.3  MG  --   --  2.0 1.9  PHOS  --   --   --  5.1*    ABG: No results for input(s): PHART, PCO2ART, PO2ART, HCO3, O2SAT in the last 168 hours.  Liver Function Tests: Recent Labs  Lab 08/14/18 0639  ALBUMIN 2.6*   No results for input(s): LIPASE, AMYLASE in the last 168 hours. No results for input(s): AMMONIA in the last 168 hours.  CBC: Recent Labs  Lab 08/07/18 1148 08/10/18 0545 08/11/18 0618 08/14/18 0639  WBC 12.8* 9.7 10.2 10.3  HGB 7.9* 7.4* 7.9* 8.0*  HCT 25.5*  23.9* 26.1* 26.6*  MCV 92.1 94.5 93.5 93.3  PLT 411* 394 436* 492*    Cardiac Enzymes: No results for input(s): CKTOTAL, CKMB, CKMBINDEX, TROPONINI in the last 168 hours.  BNP (last 3 results) No results for input(s): BNP in the last 8760 hours.  ProBNP (last 3 results) No results for input(s): PROBNP in the last 8760 hours.  Radiological Exams: No results found.  Assessment/Plan Principal Problem:   Acute on chronic respiratory failure with hypoxia (HCC) Active Problems:   Chronic atrial fibrillation (HCC)   Acute systolic heart failure (HCC)   Tracheostomy care (HCC)   Healthcare-associated pneumonia   HIT (heparin-induced thrombocytopenia) (HCC)   Generalized anxiety disorder   Major depressive disorder, recurrent episode, mild ([place])   1. Acute on chronic respiratory failure with hypoxia we will continue with full support on T collar.  Also recommended getting an ENT consultation because of the bleeding. 2. Chronic atrial fibrillation rate is controlled we will continue to follow 3. Acute systolic heart failure compensated at baseline 4. Healthcare associated pneumonia treated we will continue to follow 5. HIT syndrome platelets have been stable 6. Tracheostomy as noted above has some issues with bleeding we will have ENT evaluate discussed with the primary care team on rounds   I have personally seen and evaluated the patient, evaluated laboratory and imaging results,  formulated the assessment and plan and placed orders. The Patient requires high complexity decision making for assessment and support.  Case was discussed on Rounds with the Respiratory Therapy Staff  Allyne Gee, MD [facility] Pulmonary Critical Care Medicine Sleep Medicine

## 2018-08-15 DIAGNOSIS — J189 Pneumonia, unspecified organism: Secondary | ICD-10-CM | POA: Diagnosis not present

## 2018-08-15 DIAGNOSIS — I5021 Acute systolic (congestive) heart failure: Secondary | ICD-10-CM | POA: Diagnosis not present

## 2018-08-15 DIAGNOSIS — J9621 Acute and chronic respiratory failure with hypoxia: Secondary | ICD-10-CM | POA: Diagnosis not present

## 2018-08-15 DIAGNOSIS — I482 Chronic atrial fibrillation: Secondary | ICD-10-CM | POA: Diagnosis not present

## 2018-08-15 LAB — RENAL FUNCTION PANEL
ALBUMIN: 2.6 g/dL — AB (ref 3.5–5.0)
ANION GAP: 13 (ref 5–15)
BUN: 29 mg/dL — ABNORMAL HIGH (ref 6–20)
CALCIUM: 8.8 mg/dL — AB (ref 8.9–10.3)
CO2: 28 mmol/L (ref 22–32)
CREATININE: 1.04 mg/dL — AB (ref 0.44–1.00)
Chloride: 92 mmol/L — ABNORMAL LOW (ref 98–111)
GFR calc non Af Amer: 57 mL/min — ABNORMAL LOW (ref >60)
Glucose, Bld: 114 mg/dL — ABNORMAL HIGH (ref 70–99)
PHOSPHORUS: 5.5 mg/dL — AB (ref 2.5–4.6)
Potassium: 3.8 mmol/L (ref 3.5–5.1)
SODIUM: 133 mmol/L — AB (ref 135–145)

## 2018-08-15 LAB — CBC
HCT: 23.8 % — ABNORMAL LOW (ref 36.0–46.0)
Hemoglobin: 7.3 g/dL — ABNORMAL LOW (ref 12.0–15.0)
MCH: 28.5 pg (ref 26.0–34.0)
MCHC: 30.7 g/dL (ref 30.0–36.0)
MCV: 93 fL (ref 78.0–100.0)
PLATELETS: 441 10*3/uL — AB (ref 150–400)
RBC: 2.56 MIL/uL — ABNORMAL LOW (ref 3.87–5.11)
RDW: 17.1 % — ABNORMAL HIGH (ref 11.5–15.5)
WBC: 8.2 10*3/uL (ref 4.0–10.5)

## 2018-08-15 LAB — MAGNESIUM: Magnesium: 2 mg/dL (ref 1.7–2.4)

## 2018-08-15 NOTE — Progress Notes (Signed)
Pulmonary Critical Care Medicine [place]   PULMONARY CRITICAL CARE SERVICE  PROGRESS NOTE  Date of Service: 08/15/2018  Shannon Roman  LKT:[id number]  DOB: October 14, 1956   DOA: 07/20/2018  Referring Physician: Merton Border, MD  HPI: Shannon Roman is a 62 y.o. female seen for follow up of Acute on Chronic Respiratory Failure.  She is doing well on T collar.  She has been having intermittent small amounts of hemoptysis.  Patient's hemoptysis today is actually resolved.  Right now is on PMV and is tolerating that well.  Medications: Reviewed on Rounds  Physical Exam:  Vitals: Temperature 96.4 pulse 65 respiratory rate 18 blood pressure 115/68 saturations 100%  Ventilator Settings off the ventilator on T collar with PMV  . General: Comfortable at this time . Eyes: Grossly normal lids, irises & conjunctiva . ENT: grossly tongue is normal . Neck: no obvious mass . Cardiovascular: S1 S2 normal no gallop . Respiratory: No rhonchi or rales are noted at this time . Abdomen: soft . Skin: no rash seen on limited exam . Musculoskeletal: not rigid . Psychiatric:unable to assess . Neurologic: no seizure no involuntary movements         Lab Data:   Basic Metabolic Panel: Recent Labs  Lab 08/10/18 0545 08/12/18 0720 08/14/18 0639 08/15/18 0650  NA 139 136 135 133*  K 4.4 4.0 3.7 3.8  CL 102 95* 92* 92*  CO2 29 28 28 28   GLUCOSE 106* 102* 110* 114*  BUN 41* 31* 30* 29*  CREATININE 1.03* 1.04* 1.03* 1.04*  CALCIUM 8.8* 9.0 9.3 8.8*  MG  --  2.0 1.9 2.0  PHOS  --   --  5.1* 5.5*    ABG: No results for input(s): PHART, PCO2ART, PO2ART, HCO3, O2SAT in the last 168 hours.  Liver Function Tests: Recent Labs  Lab 08/14/18 0639 08/15/18 0650  ALBUMIN 2.6* 2.6*   No results for input(s): LIPASE, AMYLASE in the last 168 hours. No results for input(s): AMMONIA in the last 168 hours.  CBC: Recent Labs  Lab 08/10/18 0545 08/11/18 0618 08/14/18 0639  08/15/18 0650  WBC 9.7 10.2 10.3 8.2  HGB 7.4* 7.9* 8.0* 7.3*  HCT 23.9* 26.1* 26.6* 23.8*  MCV 94.5 93.5 93.3 93.0  PLT 394 436* 492* 441*    Cardiac Enzymes: No results for input(s): CKTOTAL, CKMB, CKMBINDEX, TROPONINI in the last 168 hours.  BNP (last 3 results) No results for input(s): BNP in the last 8760 hours.  ProBNP (last 3 results) No results for input(s): PROBNP in the last 8760 hours.  Radiological Exams: Dg Chest Port 1 View  Result Date: 08/14/2018 CLINICAL DATA:  Episodes of bleeding from the tracheostomy. Recent healthcare associated pneumonia. Chronic respiratory failure, CHF. EXAM: PORTABLE CHEST 1 VIEW COMPARISON:  Chest x-ray of August 10, 2018 FINDINGS: The lungs are well-expanded. The lung markings are coarse at both bases. This has improved slightly since the previous study. The tracheostomy tube tip projects between the clavicular heads. The patient has undergone previous CABG. Coronary artery stents are visible. The cardiac silhouette is top-normal in size but stable. The pulmonary vascularity is normal. There is calcification in the wall of the aortic arch. IMPRESSION: Slight interval improvement in bibasilar atelectasis. Decreased pulmonary vascular congestion. No acute pneumonia. No obvious etiology for the patient's bleeding around the tracheostomy site. Thoracic aortic atherosclerosis. Electronically Signed   By: David  Martinique M.D.   On: 08/14/2018 13:16    Assessment/Plan Principal Problem:  Acute on chronic respiratory failure with hypoxia (HCC) Active Problems:   Chronic atrial fibrillation (HCC)   Acute systolic heart failure (HCC)   Tracheostomy care ([place])   Healthcare-associated pneumonia   HIT (heparin-induced thrombocytopenia) (HCC)   Generalized anxiety disorder   Major depressive disorder, recurrent episode, mild ([place])   1. Acute on chronic respiratory failure with hypoxia patient is doing well except for the intermittent hemoptysis as  noted.  Last chest x-ray which was done on the 17th had actually shown improvement in the basilar atelectasis and decrease in the pulmonary vascular congestion.  Suspect the hemoptysis might be related to suction trauma will need to ease up on the suction consider a saline lavages as I have discussed on rounds with the respiratory therapist. 2. Acute systolic heart failure clinically and radiologically improving we will continue with supportive care 3. Tracheostomy as above hemoptysis minor right now needs less aggressive suctioning and needs to possibly ice lavage 4. Healthcare associated pneumonia treated with antibiotics we will continue with supportive care. 5. HIT syndrome has been stable   I have personally seen and evaluated the patient, evaluated laboratory and imaging results, formulated the assessment and plan and placed orders. The Patient requires high complexity decision making for assessment and support.  Case was discussed on Rounds with the Respiratory Therapy Staff  Allyne Gee, MD [facility] Pulmonary Critical Care Medicine Sleep Medicine

## 2018-08-16 LAB — CBC
HCT: 23.4 % — ABNORMAL LOW (ref 36.0–46.0)
Hemoglobin: 7.3 g/dL — ABNORMAL LOW (ref 12.0–15.0)
MCH: 28.9 pg (ref 26.0–34.0)
MCHC: 31.2 g/dL (ref 30.0–36.0)
MCV: 92.5 fL (ref 78.0–100.0)
Platelets: 458 K/uL — ABNORMAL HIGH (ref 150–400)
RBC: 2.53 MIL/uL — ABNORMAL LOW (ref 3.87–5.11)
RDW: 16.7 % — ABNORMAL HIGH (ref 11.5–15.5)
WBC: 7.8 K/uL (ref 4.0–10.5)

## 2018-08-16 LAB — RENAL FUNCTION PANEL
Albumin: 2.6 g/dL — ABNORMAL LOW (ref 3.5–5.0)
Anion gap: 13 (ref 5–15)
BUN: 29 mg/dL — ABNORMAL HIGH (ref 6–20)
CO2: 29 mmol/L (ref 22–32)
Calcium: 8.9 mg/dL (ref 8.9–10.3)
Chloride: 92 mmol/L — ABNORMAL LOW (ref 98–111)
Creatinine, Ser: 1.11 mg/dL — ABNORMAL HIGH (ref 0.44–1.00)
GFR calc Af Amer: >60 mL/min
GFR calc non Af Amer: 53 mL/min — ABNORMAL LOW
Glucose, Bld: 115 mg/dL — ABNORMAL HIGH (ref 70–99)
Phosphorus: 5.2 mg/dL — ABNORMAL HIGH (ref 2.5–4.6)
Potassium: 4.2 mmol/L (ref 3.5–5.1)
Sodium: 134 mmol/L — ABNORMAL LOW (ref 135–145)

## 2018-08-16 LAB — MAGNESIUM: Magnesium: 2 mg/dL (ref 1.7–2.4)

## 2018-09-04 ENCOUNTER — Encounter (HOSPITAL_COMMUNITY): Payer: Self-pay | Admitting: Interventional Radiology

## 2019-01-11 IMAGING — DX DG ABDOMEN 1V
1 series · 1 of 1 positions shown · non-contrast
Comparison: None.

CLINICAL DATA: Initial evaluation for gastrostomy tube placement.

EXAM:
ABDOMEN - 1 VIEW

[abdomen kub]
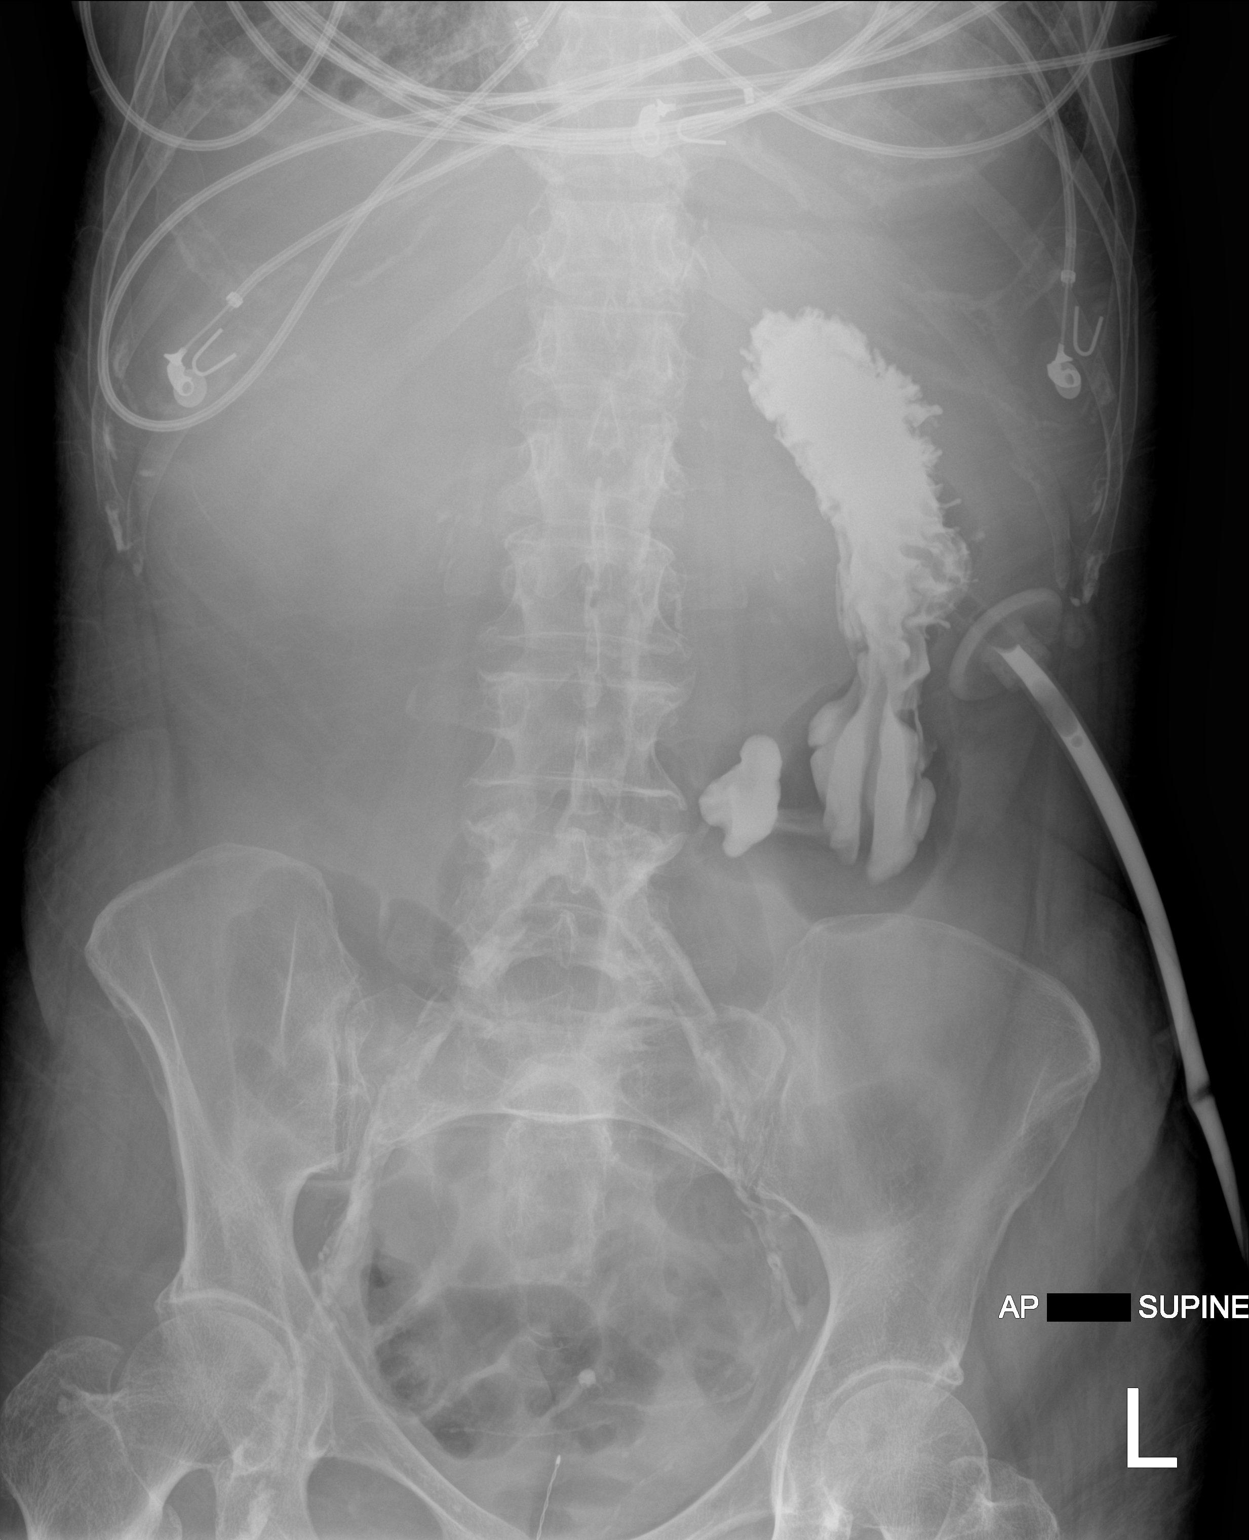

[1 of 1 positions shown; findings below may reference images not displayed]

FINDINGS: Contrast material is been instilled via a percutaneous gastrostomy
tube. Contrast seen filling the gastric fundus and body. No contrast
extravasation to suggest leak or malpositioning.

Bowel gas pattern within normal limits.

Prominent aorto bi-iliac atherosclerotic calcifications.

Degenerative changes noted within the visualized spine and about the
hips bilaterally.
IMPRESSION: Percutaneous G-tube within the stomach. No extravasation of contrast
to suggest leak or malpositioning.

## 2019-01-23 IMAGING — XA IR REPLACE G-TUBE/COLONIC TUBE
1 series · 4 of 4 positions shown · non-contrast
Comparison: None.

INDICATION: Interval removal of chronic gastrostomy tube. Please perform
fluoroscopic guided exchange.

EXAM:
FLUOROSCOPIC GUIDED REPLACEMENT OF GASTROSTOMY TUBE

[Series 2: fl (-) angio · 4 of 7 frames shown]
[frame 2/7]
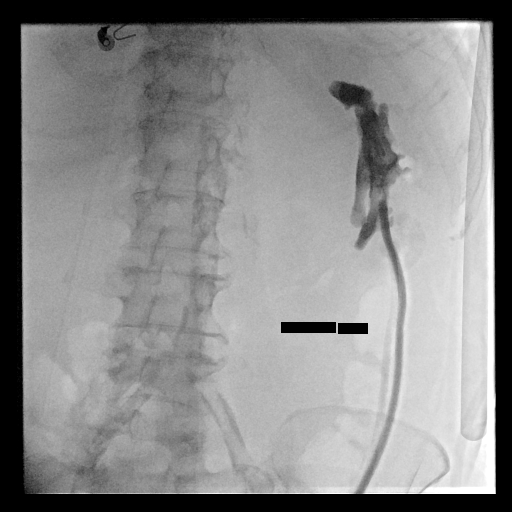
[frame 3/7]
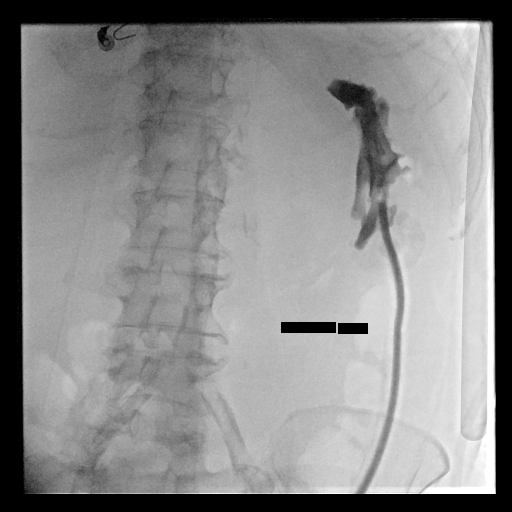
[frame 4/7]
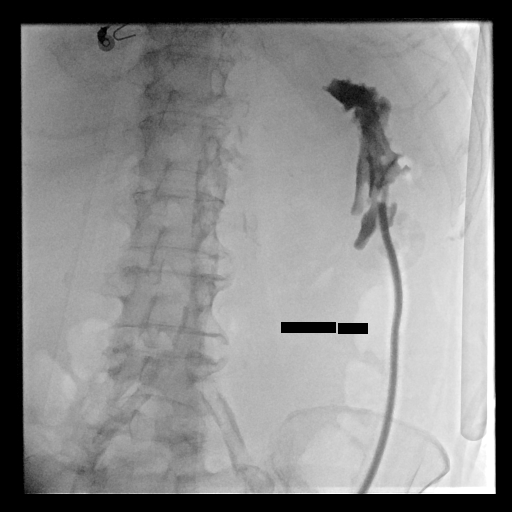
[frame 6/7]
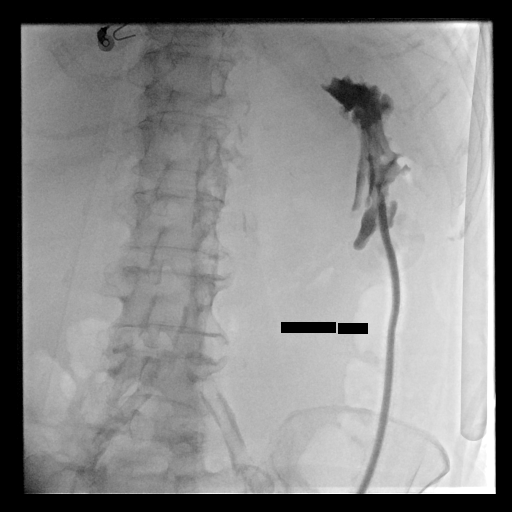

[4 of 4 positions shown; findings below may reference images not displayed]

MEDICATIONS:
None.

CONTRAST:  10 cc Vsovue-VFF administered into the gastric lumen

FLUOROSCOPY TIME:  6 seconds (0.2 mGy)

COMPLICATIONS:
None immediate.

PROCEDURE:
The Foley catheter maintaining the patency of the chronic
gastrostomy track was removed intact.

Next, a 20 French balloon retention gastrostomy tube was inserted
through the chronic gastrostomy track and the retention balloon was
insufflated with small amount of dilute saline. The external disc
was cinched.

Multiple spot fluoroscopic images were obtained in various
obliquities following the injection of small amount of contrast
demonstrating appropriate intraluminal positioning.

A dressing was placed. The patient tolerated the procedure well
without immediate postprocedural complication.
IMPRESSION: Successful fluoroscopic guided replacement of a new 20-French
gastrostomy tube. The new gastrostomy tube is ready for immediate
use.

## 2019-02-01 IMAGING — DX DG CHEST 1V PORT
1 series · 1 of 1 positions shown · non-contrast
Comparison: 08/01/2018

CLINICAL DATA: Pleural effusion

EXAM:
PORTABLE CHEST 1 VIEW

[chest]
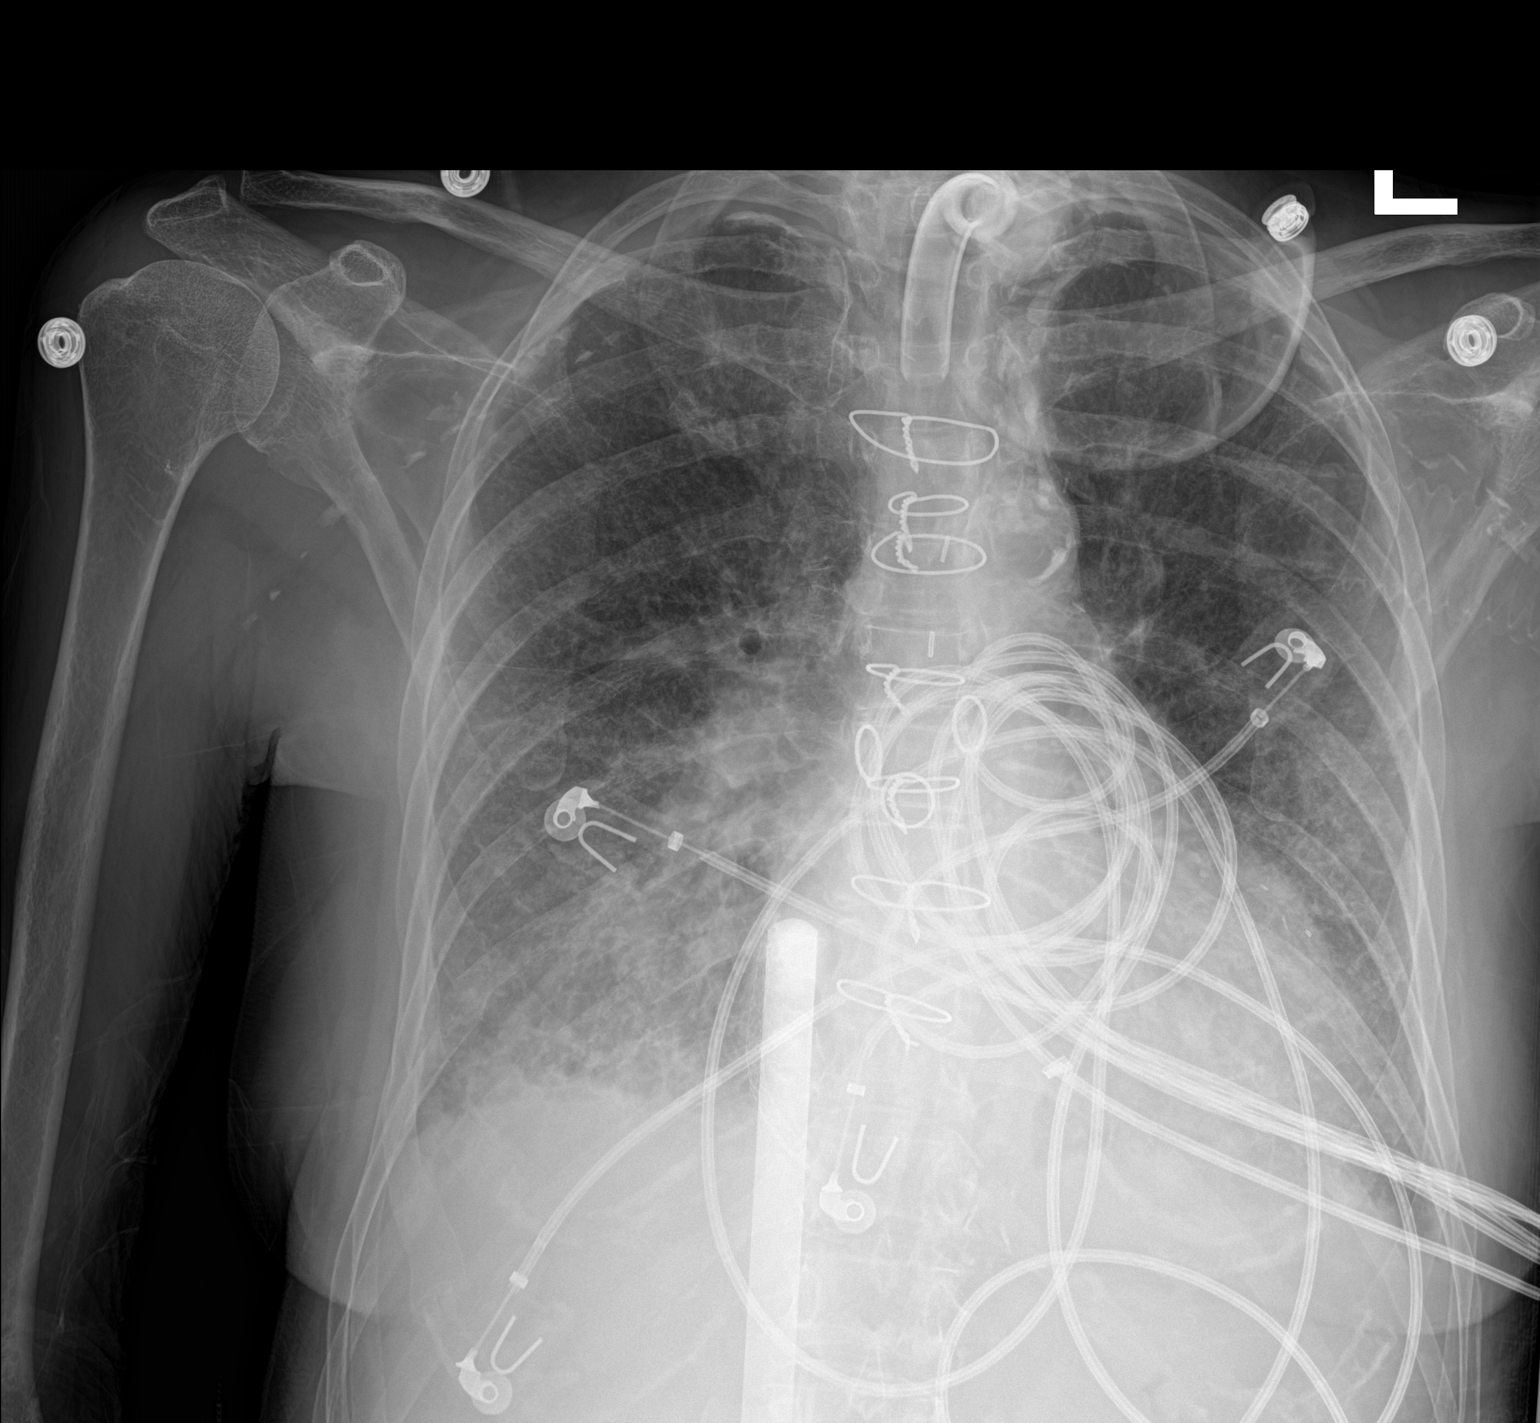

[1 of 1 positions shown; findings below may reference images not displayed]

FINDINGS: Cardiac shadow is stable. Postsurgical changes are noted as are
coronary stents. Tracheostomy tube is noted in satisfactory
position. The lungs are again well aerated. Some slight increase in
the degree of vascular congestion and interstitial edema is noted.
Some increasing right basilar atelectasis is seen. No bony
abnormality is noted.
IMPRESSION: Increasing right basilar atelectasis and vascular congestion.

## 2019-02-05 IMAGING — DX DG CHEST 1V PORT
1 series · 1 of 1 positions shown · non-contrast
Comparison: Chest x-ray of August 10, 2018

CLINICAL DATA: Episodes of bleeding from the tracheostomy. Recent
healthcare associated pneumonia. Chronic respiratory failure, CHF.

EXAM:
PORTABLE CHEST 1 VIEW

[chest]
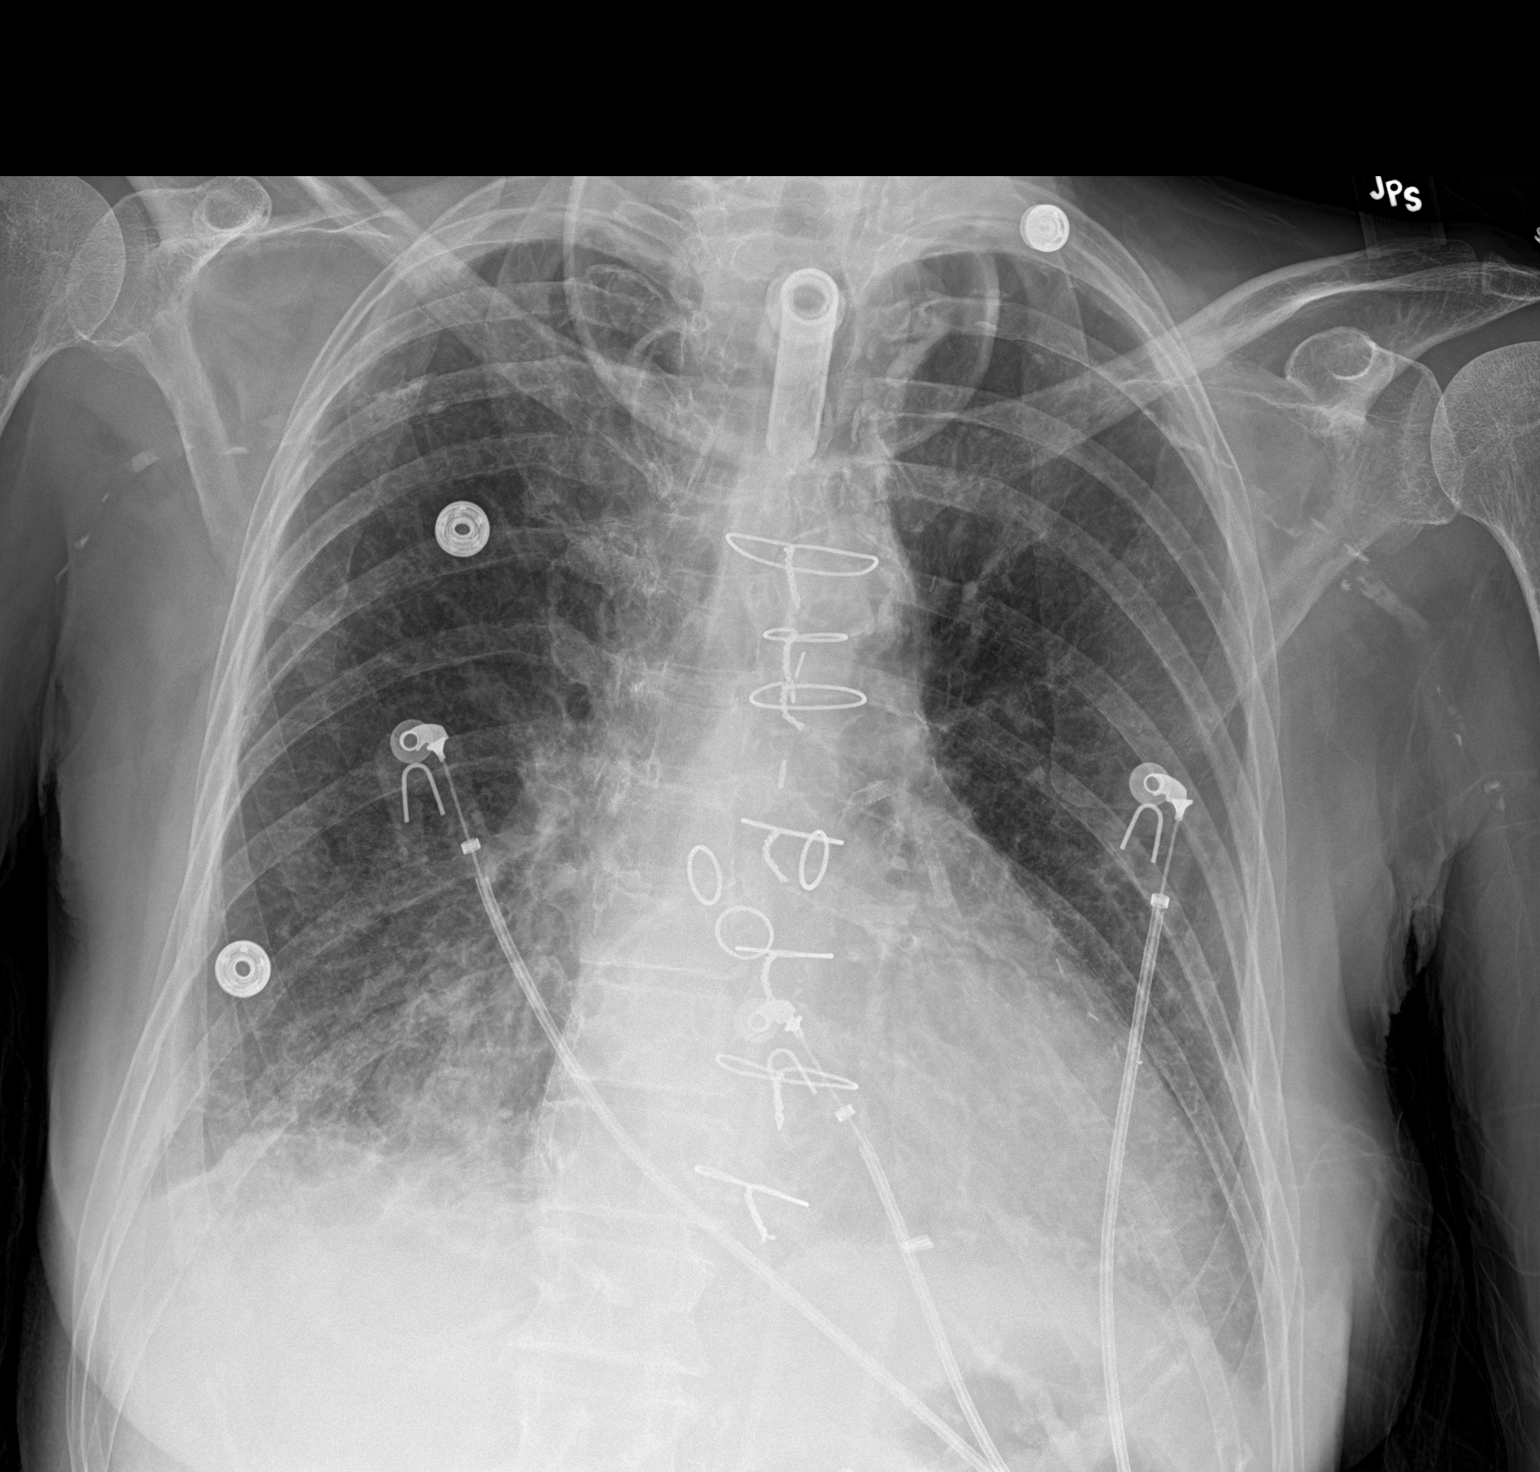

[1 of 1 positions shown; findings below may reference images not displayed]

FINDINGS: The lungs are well-expanded. The lung markings are coarse at both
bases. This has improved slightly since the previous study. The
tracheostomy tube tip projects between the clavicular heads. The
patient has undergone previous CABG. Coronary artery stents are
visible. The cardiac silhouette is top-normal in size but stable.
The pulmonary vascularity is normal. There is calcification in the
wall of the aortic arch.
IMPRESSION: Slight interval improvement in bibasilar atelectasis. Decreased
pulmonary vascular congestion. No acute pneumonia. No obvious
etiology for the patient's bleeding around the tracheostomy site.

Thoracic aortic atherosclerosis.

## 2020-03-28 DEATH — deceased
